# Patient Record
Sex: Female | Born: 1950 | Hispanic: Yes | Marital: Married | Smoking: Never smoker
Health system: Southern US, Community
[De-identification: ages and names within clinical notes are randomized; demographics above are authoritative.]

## PROBLEM LIST (undated history)

## (undated) DIAGNOSIS — I10 Essential (primary) hypertension: Secondary | ICD-10-CM

## (undated) DIAGNOSIS — K7682 Hepatic encephalopathy: Secondary | ICD-10-CM

## (undated) DIAGNOSIS — K269 Duodenal ulcer, unspecified as acute or chronic, without hemorrhage or perforation: Secondary | ICD-10-CM

## (undated) DIAGNOSIS — H527 Unspecified disorder of refraction: Secondary | ICD-10-CM

## (undated) DIAGNOSIS — R1030 Lower abdominal pain, unspecified: Secondary | ICD-10-CM

## (undated) DIAGNOSIS — I358 Other nonrheumatic aortic valve disorders: Secondary | ICD-10-CM

## (undated) DIAGNOSIS — S9032XA Contusion of left foot, initial encounter: Secondary | ICD-10-CM

## (undated) DIAGNOSIS — K219 Gastro-esophageal reflux disease without esophagitis: Secondary | ICD-10-CM

## (undated) DIAGNOSIS — I81 Portal vein thrombosis: Secondary | ICD-10-CM

## (undated) DIAGNOSIS — D696 Thrombocytopenia, unspecified: Secondary | ICD-10-CM

## (undated) DIAGNOSIS — K7460 Unspecified cirrhosis of liver: Secondary | ICD-10-CM

## (undated) DIAGNOSIS — A419 Sepsis, unspecified organism: Secondary | ICD-10-CM

## (undated) DIAGNOSIS — R7881 Bacteremia: Secondary | ICD-10-CM

## (undated) DIAGNOSIS — M1990 Unspecified osteoarthritis, unspecified site: Secondary | ICD-10-CM

## (undated) DIAGNOSIS — B1920 Unspecified viral hepatitis C without hepatic coma: Secondary | ICD-10-CM

## (undated) DIAGNOSIS — B9561 Methicillin susceptible Staphylococcus aureus infection as the cause of diseases classified elsewhere: Secondary | ICD-10-CM

## (undated) DIAGNOSIS — Z8679 Personal history of other diseases of the circulatory system: Secondary | ICD-10-CM

## (undated) DIAGNOSIS — M722 Plantar fascial fibromatosis: Secondary | ICD-10-CM

## (undated) DIAGNOSIS — B9681 Helicobacter pylori [H. pylori] as the cause of diseases classified elsewhere: Secondary | ICD-10-CM

## (undated) DIAGNOSIS — C220 Liver cell carcinoma: Secondary | ICD-10-CM

## (undated) DIAGNOSIS — M25551 Pain in right hip: Secondary | ICD-10-CM

## (undated) DIAGNOSIS — Z1211 Encounter for screening for malignant neoplasm of colon: Secondary | ICD-10-CM

## (undated) DIAGNOSIS — N814 Uterovaginal prolapse, unspecified: Secondary | ICD-10-CM

## (undated) DIAGNOSIS — R338 Other retention of urine: Secondary | ICD-10-CM

## (undated) DIAGNOSIS — K7290 Hepatic failure, unspecified without coma: Secondary | ICD-10-CM

## (undated) HISTORY — DX: Liver cell carcinoma: C22.0

## (undated) HISTORY — DX: Pain in right hip: M25.551

## (undated) HISTORY — DX: Essential (primary) hypertension: I10

## (undated) HISTORY — DX: Lower abdominal pain, unspecified: R10.30

## (undated) HISTORY — DX: Hepatic failure, unspecified without coma: K72.90

## (undated) HISTORY — DX: Bacteremia: R78.81

## (undated) HISTORY — DX: Gastro-esophageal reflux disease without esophagitis: K21.9

## (undated) HISTORY — DX: Encounter for screening for malignant neoplasm of colon: Z12.11

## (undated) HISTORY — DX: Plantar fascial fibromatosis: M72.2

## (undated) HISTORY — DX: Methicillin susceptible Staphylococcus aureus infection as the cause of diseases classified elsewhere: B95.61

## (undated) HISTORY — DX: Duodenal ulcer, unspecified as acute or chronic, without hemorrhage or perforation: K26.9

## (undated) HISTORY — DX: Contusion of left foot, initial encounter: S90.32XA

## (undated) HISTORY — PX: TUBAL LIGATION: SHX77

## (undated) HISTORY — DX: Unspecified disorder of refraction: H52.7

## (undated) HISTORY — DX: Hepatic encephalopathy: K76.82

## (undated) HISTORY — DX: Other nonrheumatic aortic valve disorders: I35.8

## (undated) HISTORY — DX: Personal history of other diseases of the circulatory system: Z86.79

## (undated) HISTORY — DX: Portal vein thrombosis: I81

## (undated) HISTORY — DX: Thrombocytopenia, unspecified: D69.6

## (undated) HISTORY — PX: UPPER GI ENDOSCOPY: SHX6162

## (undated) HISTORY — DX: Other retention of urine: R33.8

## (undated) HISTORY — DX: Sepsis, unspecified organism: A41.9

## (undated) HISTORY — DX: Helicobacter pylori (H. pylori) as the cause of diseases classified elsewhere: B96.81

---

## 2020-01-22 ENCOUNTER — Emergency Department
Admission: EM | Admit: 2020-01-22 | Discharge: 2020-01-22 | Disposition: A | Discharge status: Home / Self Care | Payer: Medicare Other | Attending: Emergency Medicine | Admitting: Emergency Medicine

## 2020-01-22 ENCOUNTER — Emergency Department: Payer: Medicare Other

## 2020-01-22 ENCOUNTER — Encounter: Payer: Self-pay | Admitting: *Deleted

## 2020-01-22 ENCOUNTER — Other Ambulatory Visit: Payer: Self-pay

## 2020-01-22 DIAGNOSIS — R251 Tremor, unspecified: Secondary | ICD-10-CM | POA: Diagnosis not present

## 2020-01-22 DIAGNOSIS — R1030 Lower abdominal pain, unspecified: Secondary | ICD-10-CM | POA: Insufficient documentation

## 2020-01-22 DIAGNOSIS — R1031 Right lower quadrant pain: Secondary | ICD-10-CM

## 2020-01-22 HISTORY — DX: Uterovaginal prolapse, unspecified: N81.4

## 2020-01-22 HISTORY — DX: Unspecified viral hepatitis C without hepatic coma: B19.20

## 2020-01-22 HISTORY — DX: Unspecified cirrhosis of liver: K74.60

## 2020-01-22 LAB — URINALYSIS, COMPLETE (UACMP) WITH MICROSCOPIC
Bilirubin Urine: NEGATIVE
Glucose, UA: NEGATIVE mg/dL
Hgb urine dipstick: NEGATIVE
Ketones, ur: 5 mg/dL — AB
Leukocytes,Ua: NEGATIVE
Nitrite: NEGATIVE
Protein, ur: NEGATIVE mg/dL
Specific Gravity, Urine: 1.023 (ref 1.005–1.030)
pH: 5 (ref 5.0–8.0)

## 2020-01-22 LAB — CBC
HCT: 43.1 % (ref 36.0–46.0)
Hemoglobin: 14.6 g/dL (ref 12.0–15.0)
MCH: 33 pg (ref 26.0–34.0)
MCHC: 33.9 g/dL (ref 30.0–36.0)
MCV: 97.5 fL (ref 80.0–100.0)
Platelets: 123 10*3/uL — ABNORMAL LOW (ref 150–400)
RBC: 4.42 MIL/uL (ref 3.87–5.11)
RDW: 14.6 % (ref 11.5–15.5)
WBC: 5.9 10*3/uL (ref 4.0–10.5)
nRBC: 0 % (ref 0.0–0.2)

## 2020-01-22 LAB — COMPREHENSIVE METABOLIC PANEL
ALT: 29 U/L (ref 0–44)
AST: 53 U/L — ABNORMAL HIGH (ref 15–41)
Albumin: 3.2 g/dL — ABNORMAL LOW (ref 3.5–5.0)
Alkaline Phosphatase: 186 U/L — ABNORMAL HIGH (ref 38–126)
Anion gap: 8 (ref 5–15)
BUN: 16 mg/dL (ref 8–23)
CO2: 23 mmol/L (ref 22–32)
Calcium: 8.7 mg/dL — ABNORMAL LOW (ref 8.9–10.3)
Chloride: 105 mmol/L (ref 98–111)
Creatinine, Ser: 0.61 mg/dL (ref 0.44–1.00)
GFR calc Af Amer: >60 mL/min (ref >60)
GFR calc non Af Amer: >60 mL/min (ref >60)
Glucose, Bld: 116 mg/dL — ABNORMAL HIGH (ref 70–99)
Potassium: 3.4 mmol/L — ABNORMAL LOW (ref 3.5–5.1)
Sodium: 136 mmol/L (ref 135–145)
Total Bilirubin: 3.1 mg/dL — ABNORMAL HIGH (ref 0.3–1.2)
Total Protein: 7.4 g/dL (ref 6.5–8.1)

## 2020-01-22 LAB — LIPASE, BLOOD: Lipase: 41 U/L (ref 11–51)

## 2020-01-22 LAB — AMMONIA: Ammonia: 34 umol/L (ref 9–35)

## 2020-01-22 MED ORDER — HALOPERIDOL LACTATE 5 MG/ML IJ SOLN
5.0000 mg | Freq: Once | INTRAMUSCULAR | Status: AC
  Administered 2020-01-22: 5 mg via INTRAVENOUS
  Filled 2020-01-22: qty 1

## 2020-01-22 MED ORDER — IOHEXOL 300 MG/ML  SOLN
100.0000 mL | Freq: Once | INTRAMUSCULAR | Status: AC | PRN
  Administered 2020-01-22: 100 mL via INTRAVENOUS

## 2020-01-22 MED ORDER — TRAMADOL HCL 50 MG PO TABS: 50.0000 mg | ORAL_TABLET | Freq: Four times a day (QID) | ORAL | 0 refills | Status: AC | PRN

## 2020-01-22 MED ORDER — GADOBUTROL 1 MMOL/ML IV SOLN
7.5000 mL | Freq: Once | INTRAVENOUS | Status: AC | PRN
  Administered 2020-01-22: 10 mL via INTRAVENOUS

## 2020-01-22 MED ORDER — TRAMADOL HCL 50 MG PO TABS
50.0000 mg | ORAL_TABLET | Freq: Once | ORAL | Status: AC
  Administered 2020-01-22: 50 mg via ORAL
  Filled 2020-01-22: qty 1

## 2020-01-22 MED ORDER — SODIUM CHLORIDE 0.9 % IV SOLN: Freq: Once | INTRAVENOUS | Status: AC

## 2020-01-22 NOTE — ED Notes (Signed)
Pt c/o weakness and pain in her legs that started this morning. Pt states she is noramally able to walk but has not been able to today. Pt is aox4, nad noted, speaking with daughter on personal cell. Pt provided additional blanket. CMS in lower extremities is intact.

## 2020-01-22 NOTE — ED Notes (Signed)
Pt transported to CT at this time.

## 2020-01-22 NOTE — ED Notes (Signed)
[facility] called- states that radiologist reading will not be available until morning but they will have a preliminary reading done. MD notified.

## 2020-01-22 NOTE — ED Provider Notes (Signed)
[facility] Emergency Department Provider Note  ____________________________________________   I have reviewed the triage vital signs and the nursing notes.   HISTORY  Chief Complaint Abdominal Pain   History limited by: Not Limited   HPI Linda Romero is a 69 y.o. female who presents to the emergency department today because of concerns for abdominal pain.  The pain started last night around 3 AM.  She noticed that when she got up to use the restroom.  It is located in the suprapubic region.  She does have pain in bilateral groin as well. The pain is worse when she stands up or tries to walk.  She has not noticed any nausea vomiting or change in stooling.  Denies any dysuria or bad odor to her urine.  Denies similar symptoms in the past. Has a history of uterine prolapse states it has been fixed and this does not remind her of any problems she has had without in the past.  Denies any trauma to her abdomen.  Additionally the patient is concerned that her ammonia level might be elevated since she has had some shaking.   Records reviewed. Per medical record review patient has a history of cirrhosis, uterine prolapse. Has been seen in the past for pelvic pain. Has history of SI infection. Was seen at [facility] at the end of 2019 for hip pain, had MRI done at that time without obvious etiology.   Past Medical History:  Diagnosis Date  . Hepatitis C   . Liver cirrhosis ([place])   . Uterine prolapse     There are no problems to display for this patient.   History reviewed. No pertinent surgical history.  Prior to Admission medications   Not on File    Allergies Patient has no known allergies.  No family history on file.  Social History Social History   Tobacco Use  . Smoking status: Never Smoker  . Smokeless tobacco: Never Used  Substance Use Topics  . Alcohol use: Never  . Drug use: Never    Review of Systems Constitutional: No fever/chills Eyes: No  visual changes. ENT: No sore throat. Cardiovascular: Denies chest pain. Respiratory: Denies shortness of breath. Gastrointestinal: Positive for lower abdominal pain.   Genitourinary: Negative for dysuria. Musculoskeletal: Positive for bilateral upper leg pain. Skin: Negative for rash. Neurological: Negative for headaches, focal weakness or numbness.  ____________________________________________   PHYSICAL EXAM:  VITAL SIGNS: ED Triage Vitals  Enc Vitals Group     BP 01/22/20 1151 (!) 140/55     Pulse Rate 01/22/20 1151 86     Resp 01/22/20 1151 16     Temp 01/22/20 1151 98.9 F (37.2 C)     Temp Source 01/22/20 1151 Oral     SpO2 01/22/20 1151 100 %     Weight 01/22/20 1218 180 lb (81.6 kg)     Height 01/22/20 1218 5' 4"  (1.626 m)     Head Circumference --      Peak Flow --      Pain Score 01/22/20 1217 0   Constitutional: Alert and oriented.  Eyes: Conjunctivae are normal.  ENT      Head: Normocephalic and atraumatic.      Nose: No congestion/rhinnorhea.      Mouth/Throat: Mucous membranes are moist.      Neck: No stridor. Hematological/Lymphatic/Immunilogical: No cervical lymphadenopathy. Cardiovascular: Normal rate, regular rhythm.  No murmurs, rubs, or gallops.  Respiratory: Normal respiratory effort without tachypnea nor retractions. Breath sounds are  clear and equal bilaterally. No wheezes/rales/rhonchi. Gastrointestinal: Soft and non tender. No rebound. No guarding.  Genitourinary: Deferred Musculoskeletal: Decreased ROM of the hips. Difficulty with ambulation. Neurologic:  Normal speech and language. No gross focal neurologic deficits are appreciated.  Skin:  Skin is warm, dry and intact. No rash noted. Psychiatric: Mood and affect are normal. Speech and behavior are normal. Patient exhibits appropriate insight and judgment.  ____________________________________________    LABS (pertinent positives/negatives)  Lipase 41 UA hazy, ketones 5, 0-5 rbc and  wbc, rare bacteria, 11-20 squamous CMP na 136, k 3.4, glu 116, cr 0.61, ca 8.7, ast 53, alt 29, alk phos 186, t bili 3.1 CBC wbc 5.9, hgb 14.6, plt 123 Ammonia 34 ____________________________________________   EKG  I, Nance Pear, attending physician, personally viewed and interpreted this EKG  EKG Time: 1236 Rate: 82 Rhythm: normal sinus rhythm Axis: left axis deviation Intervals: qtc 432 QRS: narrow ST changes: no st elevation Impression: abnormal ekg ____________________________________________    RADIOLOGY  CT abd/pel Normal appendix. No obstruction. Cirrhosis.   MRI pelvis - preliminary read without infection/inflammation/fracture  ____________________________________________   PROCEDURES  Procedures  ____________________________________________   INITIAL IMPRESSION / ASSESSMENT AND PLAN / ED COURSE  Pertinent labs & imaging results that were available during my care of the patient were reviewed by me and considered in my medical decision making (see chart for details).   Patient presented to the emergency department today with suprapubic abdominal pain as well as bilateral pain which she calls her leg pain although points more to the groin area.  She does appear to have discomfort primarily with walking.  Although she denies similar symptoms in the past per chart review she was seen at [facility] for very similar sounding pain at the end of 2019.  Did have MRI done at that time without any obvious etiology of the hip pain.  I did repeat MRI today given history of infection did joints.  Preliminary read without concerning findings and certainly blood work and lack of fever would not suggest infection at this time.  I do think it is important the patient follow back with [facility] and I discussed this with the patient.  Will give patient short course of tramadol to help with discomfort.  ____________________________________________   FINAL CLINICAL  IMPRESSION(S) / ED DIAGNOSES  Final diagnoses:  Bilateral groin pain     Note: This dictation was prepared with Dragon dictation. Any transcriptional errors that result from this process are unintentional     Nance Pear, MD 01/22/20 2156

## 2020-01-22 NOTE — ED Notes (Signed)
Pt was unable to walk using walker, stating it is still too painful. Pt brought to bathroom in wheelchair. ED MD notified.

## 2020-01-22 NOTE — ED Notes (Addendum)
Daughter called at this time and update provided- mobile provided to pt. Daughter's cell number is [phone]

## 2020-01-22 NOTE — ED Notes (Signed)
Pt taken to MRI  

## 2020-01-22 NOTE — ED Triage Notes (Addendum)
Patient c/o severe lower abdominal pain since 0300 this morning. Patient states she got up to the bathroom and was in pain. Patient denies dysuria and had a regular BM at 0200 this morning. Patient has a history of liver cirrhosis. Patient states the pain is severe enough that it affects her walking. Patient states she doesn't want to see an NP or PA.

## 2020-01-22 NOTE — ED Notes (Signed)
Pt moved into room and changed into gown per provider request.

## 2020-01-22 NOTE — ED Notes (Signed)
Called MRI to check on pending result. MRI states that reading may not be done until morning due to the fact that body scan radiologist may not be available at this time. Will call [facility] to verify.

## 2020-01-22 NOTE — Discharge Instructions (Signed)
Please follow up with Dr. Lynann Bologna who has seen you in the past for similar complaint. Please seek medical attention for any high fevers, chest pain, shortness of breath, change in behavior, persistent vomiting, bloody stool or any other new or concerning symptoms.

## 2020-01-22 NOTE — ED Notes (Signed)
Husband Linda Romero updated by phone at this time.

## 2020-01-23 ENCOUNTER — Other Ambulatory Visit: Payer: Self-pay

## 2020-01-23 ENCOUNTER — Encounter: Payer: Self-pay | Admitting: Emergency Medicine

## 2020-01-23 ENCOUNTER — Emergency Department
Admission: EM | Admit: 2020-01-23 | Discharge: 2020-01-26 | Disposition: A | Discharge status: Home / Self Care | Payer: Medicare Other | Attending: Emergency Medicine | Admitting: Emergency Medicine

## 2020-01-23 DIAGNOSIS — R102 Pelvic and perineal pain: Secondary | ICD-10-CM | POA: Insufficient documentation

## 2020-01-23 DIAGNOSIS — B1920 Unspecified viral hepatitis C without hepatic coma: Secondary | ICD-10-CM | POA: Diagnosis not present

## 2020-01-23 DIAGNOSIS — Z79899 Other long term (current) drug therapy: Secondary | ICD-10-CM | POA: Diagnosis not present

## 2020-01-23 DIAGNOSIS — G8929 Other chronic pain: Secondary | ICD-10-CM | POA: Diagnosis not present

## 2020-01-23 DIAGNOSIS — R531 Weakness: Secondary | ICD-10-CM | POA: Diagnosis present

## 2020-01-23 DIAGNOSIS — M79605 Pain in left leg: Secondary | ICD-10-CM | POA: Insufficient documentation

## 2020-01-23 DIAGNOSIS — K7460 Unspecified cirrhosis of liver: Secondary | ICD-10-CM | POA: Insufficient documentation

## 2020-01-23 DIAGNOSIS — M79604 Pain in right leg: Secondary | ICD-10-CM | POA: Insufficient documentation

## 2020-01-23 DIAGNOSIS — N814 Uterovaginal prolapse, unspecified: Secondary | ICD-10-CM | POA: Insufficient documentation

## 2020-01-23 DIAGNOSIS — Z20822 Contact with and (suspected) exposure to covid-19: Secondary | ICD-10-CM | POA: Insufficient documentation

## 2020-01-23 LAB — CBC
HCT: 43.9 % (ref 36.0–46.0)
Hemoglobin: 15 g/dL (ref 12.0–15.0)
MCH: 33 pg (ref 26.0–34.0)
MCHC: 34.2 g/dL (ref 30.0–36.0)
MCV: 96.7 fL (ref 80.0–100.0)
Platelets: 110 10*3/uL — ABNORMAL LOW (ref 150–400)
RBC: 4.54 MIL/uL (ref 3.87–5.11)
RDW: 14.8 % (ref 11.5–15.5)
WBC: 13.1 10*3/uL — ABNORMAL HIGH (ref 4.0–10.5)
nRBC: 0 % (ref 0.0–0.2)

## 2020-01-23 LAB — BASIC METABOLIC PANEL
Anion gap: 8 (ref 5–15)
BUN: 17 mg/dL (ref 8–23)
CO2: 20 mmol/L — ABNORMAL LOW (ref 22–32)
Calcium: 9 mg/dL (ref 8.9–10.3)
Chloride: 106 mmol/L (ref 98–111)
Creatinine, Ser: 0.61 mg/dL (ref 0.44–1.00)
GFR calc Af Amer: >60 mL/min (ref >60)
GFR calc non Af Amer: >60 mL/min (ref >60)
Glucose, Bld: 132 mg/dL — ABNORMAL HIGH (ref 70–99)
Potassium: 3.6 mmol/L (ref 3.5–5.1)
Sodium: 134 mmol/L — ABNORMAL LOW (ref 135–145)

## 2020-01-23 LAB — GLUCOSE, CAPILLARY: Glucose-Capillary: 109 mg/dL — ABNORMAL HIGH (ref 70–99)

## 2020-01-23 MED ORDER — FUROSEMIDE 40 MG PO TABS
40.0000 mg | ORAL_TABLET | Freq: Every day | ORAL | Status: DC
  Administered 2020-01-23 – 2020-01-26 (×4): 40 mg via ORAL
  Filled 2020-01-23 (×5): qty 1

## 2020-01-23 MED ORDER — SODIUM CHLORIDE 0.9% FLUSH: 3.0000 mL | Freq: Once | INTRAVENOUS | Status: DC

## 2020-01-23 MED ORDER — LACTULOSE 10 GM/15ML PO SOLN
20.0000 g | Freq: Every day | ORAL | Status: DC
  Administered 2020-01-23 – 2020-01-26 (×4): 20 g via ORAL
  Filled 2020-01-23 (×4): qty 30

## 2020-01-23 MED ORDER — SPIRONOLACTONE 25 MG PO TABS
50.0000 mg | ORAL_TABLET | Freq: Every day | ORAL | Status: DC
  Administered 2020-01-23 – 2020-01-25 (×3): 50 mg via ORAL
  Filled 2020-01-23 (×4): qty 2

## 2020-01-23 NOTE — ED Triage Notes (Signed)
Pt presents to ED via POV with c/o weakness and leg pain to BLE, pt states was D/C yesterday and slept in the car for 6 hrs due to being unable to get out of the car when she got home. Pt drowsy but arousable upon arrival to triage room.

## 2020-01-23 NOTE — ED Triage Notes (Signed)
PT took dose of Tramadol at 1030am this morning.  Per SIL patient has had discolored urine with foul odor as well.   Pt is alert and oriented on arrival, mask in place. Pt placed in wheelchair-assisted from vehicle.

## 2020-01-23 NOTE — ED Provider Notes (Signed)
[facility] Emergency Department Provider Note  ____________________________________________  Time seen: Approximately 11:25 PM  I have reviewed the triage vital signs and the nursing notes.   HISTORY  Chief Complaint Weakness and Leg Pain    HPI Linda Romero is a 69 y.o. female with a history of liver cirrhosis who comes the ED today complaining of bilateral leg pain.  Was seen in the emergency department yesterday for similar symptoms, had extensive evaluation which was overall reassuring.  Lab panel is normal, ammonia level is normal.  Urinalysis unremarkable.  CT scan of abdomen and pelvis and MRI of the pelvis were all unremarkable without acute findings.  As noted from yesterday's evaluation in the ED, this pain appears to be chronic in nature.  Also reporting generalized weakness.  After discharge from the ED yesterday, reportedly she was not able to get herself out of her car, and slept in the car for several hours.     Past Medical History:  Diagnosis Date  . Hepatitis C   . Liver cirrhosis ([place])   . Uterine prolapse      There are no problems to display for this patient.    History reviewed. No pertinent surgical history.   Prior to Admission medications   Medication Sig Start Date End Date Taking? Authorizing Provider  furosemide (LASIX) 20 MG tablet Take 40 mg by mouth daily.   Yes [provider]  lactulose (CHRONULAC) 10 GM/15ML solution Take 20 g by mouth daily.   Yes [provider]  spironolactone (ALDACTONE) 50 MG tablet Take 50 mg by mouth daily.   Yes [provider]  traMADol (ULTRAM) 50 MG tablet Take 1 tablet (50 mg total) by mouth every 6 (six) hours as needed. 01/22/20 01/21/21 Yes Nance Pear, MD     Allergies Patient has no known allergies.   History reviewed. No pertinent family history.  Social History Social History   Tobacco Use  . Smoking status: Never Smoker  . Smokeless  tobacco: Never Used  Substance Use Topics  . Alcohol use: Never  . Drug use: Never    Review of Systems  Constitutional:   No fever or chills.  ENT:   No sore throat. No rhinorrhea. Cardiovascular:   No chest pain or syncope. Respiratory:   No dyspnea or cough. Gastrointestinal:   Negative for abdominal pain, vomiting and diarrhea.  Musculoskeletal: Chronic pelvic pain as above All other systems reviewed and are negative except as documented above in ROS and HPI.  ____________________________________________   PHYSICAL EXAM:  VITAL SIGNS: ED Triage Vitals  Enc Vitals Group     BP 01/23/20 1413 131/73     Pulse Rate 01/23/20 1413 83     Resp 01/23/20 1413 (!) 22     Temp 01/23/20 1413 98.6 F (37 C)     Temp Source 01/23/20 1413 Oral     SpO2 01/23/20 1413 94 %     Weight 01/23/20 1410 180 lb (81.6 kg)     Height 01/23/20 1410 5\' 4"  (1.626 m)     Head Circumference --      Peak Flow --      Pain Score 01/23/20 1409 3     Pain Loc --      Pain Edu? --      Excl. in [place]? --     Vital signs reviewed, nursing assessments reviewed.   Constitutional:   Alert and oriented. Non-toxic appearance.  Talking on the phone.  Cooperative  Eyes:   Conjunctivae are normal. EOMI. PERRL. ENT      Head:   Normocephalic and atraumatic.      Nose:   Wearing a mask.      Mouth/Throat:   Wearing a mask.      Neck:   No meningismus. Full ROM. Hematological/Lymphatic/Immunilogical:   No cervical lymphadenopathy. Cardiovascular:   RRR. Symmetric bilateral radial and DP pulses.  No murmurs. Cap refill less than 2 seconds. Respiratory:   Normal respiratory effort without tachypnea/retractions. Breath sounds are clear and equal bilaterally. No wheezes/rales/rhonchi. Gastrointestinal:   Soft and nontender. Non distended. There is no CVA tenderness.  No rebound, rigidity, or guarding. Musculoskeletal:   Normal range of motion in all extremities. No joint effusions.  No lower extremity tenderness.   No edema. Neurologic:   Normal speech and language.  Motor grossly intact. No acute focal neurologic deficits are appreciated.  Skin:    Skin is warm, dry and intact. No rash noted.  No petechiae, purpura, or bullae.  ____________________________________________    LABS (pertinent positives/negatives) (all labs ordered are listed, but only abnormal results are displayed) Labs Reviewed  BASIC METABOLIC PANEL - Abnormal; Notable for the following components:      Result Value   Sodium 134 (*)    CO2 20 (*)    Glucose, Bld 132 (*)    All other components within normal limits  CBC - Abnormal; Notable for the following components:   WBC 13.1 (*)    Platelets 110 (*)    All other components within normal limits  GLUCOSE, CAPILLARY - Abnormal; Notable for the following components:   Glucose-Capillary 109 (*)    All other components within normal limits  URINALYSIS, COMPLETE (UACMP) WITH MICROSCOPIC  AMMONIA  CBG MONITORING, ED   ____________________________________________   EKG Interpreted by me Normal sinus rhythm, right axis, normal normal intervals.  Normal QRS ST segments and T waves.   ____________________________________________    RADIOLOGY  No results found.  ____________________________________________   PROCEDURES Procedures  ____________________________________________    CLINICAL IMPRESSION / ASSESSMENT AND PLAN / ED COURSE  Medications ordered in the ED: Medications  sodium chloride flush (NS) 0.9 % injection 3 mL (3 mLs Intravenous Not Given 01/23/20 1657)  furosemide (LASIX) tablet 40 mg (40 mg Oral Given 01/23/20 2021)  lactulose (CHRONULAC) 10 GM/15ML solution 20 g (20 g Oral Given 01/23/20 2021)  spironolactone (ALDACTONE) tablet 50 mg (50 mg Oral Given 01/23/20 2021)    Pertinent labs & imaging results that were available during my care of the patient were reviewed by me and considered in my medical decision making (see chart for  details).  [name] was evaluated in Emergency Department on 01/23/2020 for the symptoms described in the history of present illness. She was evaluated in the context of the global COVID-19 pandemic, which necessitated consideration that the patient might be at risk for infection with the SARS-CoV-2 virus that causes COVID-19. Institutional protocols and algorithms that pertain to the evaluation of patients at risk for COVID-19 are in a state of rapid change based on information released by regulatory bodies including the CDC and federal and state organizations. These policies and algorithms were followed during the patient's care in the ED.   Patient returns to the ED after evaluation yesterday due to persistent generalized weakness and chronic pain.  Planes.  Vital signs are normal, exam is benign and reassuring.  Repeat lab panel today is unchanged, unremarkable.  Patient is lucid, calm  and cooperative.  Per report from family, patient is not able to take care of herself at home, spouse unable to care for her, unable to provide sufficient support.  Will consult PT and social work to evaluate care needs, possible placement.      ____________________________________________   FINAL CLINICAL IMPRESSION(S) / ED DIAGNOSES    Final diagnoses:  Chronic pelvic pain in female  Cirrhosis of liver without ascites, unspecified hepatic cirrhosis type [facility])     ED Discharge Orders    None      Portions of this note were generated with dragon dictation software. Dictation errors may occur despite best attempts at proofreading.   Carrie Mew, MD 01/23/20 2330

## 2020-01-23 NOTE — ED Triage Notes (Signed)
FIRST NURSE NOTE: Pt arrived via POV with daughter and son in law, pt was seen here last night and discharged. Per son in law, the patient was discharged unsafely because she slept in her car for 6 hours because she could not ambulate and her husband is not capable of helping her.  Son-in-law states that they live nearby the patient to help, but have a newborn at home.   Pt is not able to walk and had to be helped cleaned up as well.   Son-in-law is asking for placement for patient.  Advised him that typically it is difficult to place from the ED if they do not meet admission criteria and many times will hold in the ED.  Advised SIL that most likely case management will be involved with patient disposition.  SIL Marin Shutter [phone]

## 2020-01-23 NOTE — ED Notes (Signed)
Pt provided with meal tray.

## 2020-01-24 ENCOUNTER — Encounter: Payer: Self-pay | Admitting: Licensed Clinical Social Worker

## 2020-01-24 DIAGNOSIS — R102 Pelvic and perineal pain: Secondary | ICD-10-CM | POA: Diagnosis not present

## 2020-01-24 LAB — URINALYSIS, COMPLETE (UACMP) WITH MICROSCOPIC
Bilirubin Urine: NEGATIVE
Glucose, UA: NEGATIVE mg/dL
Ketones, ur: NEGATIVE mg/dL
Nitrite: NEGATIVE
Protein, ur: NEGATIVE mg/dL
Specific Gravity, Urine: 1.01 (ref 1.005–1.030)
pH: 5 (ref 5.0–8.0)

## 2020-01-24 MED ORDER — OXYCODONE-ACETAMINOPHEN 5-325 MG PO TABS
2.0000 | ORAL_TABLET | Freq: Once | ORAL | Status: AC
  Administered 2020-01-24: 2 via ORAL
  Filled 2020-01-24: qty 2

## 2020-01-24 NOTE — ED Notes (Signed)
Pt with urge to have BM. Pt placed on bedpan and instructed to use call light when she is done.

## 2020-01-24 NOTE — ED Notes (Signed)
Pt had lg BM on bedpan. Pt given lunch tray. No further needs at this time.

## 2020-01-24 NOTE — ED Notes (Signed)
Pt given meal tray and apple juice at this time

## 2020-01-24 NOTE — ED Notes (Signed)
Pt placed on bedpan. Pt able to roll to the side but reporting pain with movement.

## 2020-01-24 NOTE — ED Notes (Signed)
Pts daughter [name] called for an updated. Family had requested a call for an update from MD and report they have yet to receive a phone call. RN apologized for inconvience and family was calm and requesting an update. Family updated to the best of this RNs ability but is requesting social work to call them and update after evaluation.

## 2020-01-24 NOTE — TOC Progression Note (Addendum)
Transition of Care [facility]) - Progression Note    Patient Details  Name: Linda Romero MRN: [id number] Date of Birth: 1951-03-28  Transition of Care [facility]) CM/SW [place], [place] Phone Number: [phone] 01/24/2020, 2:03 PM  Clinical Narrative:    Megan Mans, for SNF placement to [place], [facility], [place], [place].  Family also wanted [facility] but they do not have SNF placement available. Waiting for replies.   Expected Discharge Plan: [place]    Expected Discharge Plan and Services Expected Discharge Plan: [place] Choice: [place] arrangements for the past 2 months: Single Family Home                                       Social Determinants of Health (SDOH) Interventions    Readmission Risk Interventions No flowsheet data found.

## 2020-01-24 NOTE — NC FL2 (Addendum)
  [place] LEVEL OF CARE SCREENING TOOL     IDENTIFICATION  Patient Name: Linda Romero Birthdate: [birth year] Sex: female Admission Date (Current Location): 01/23/2020  Accident and [place] Number:  Engineering geologist and Address:  [facility], [address] [place], [place] [address]      Provider Number: [id number]  Attending Physician Name and Address:  No att. providers found  Relative Name and Phone Number:  Adyn Askins [phone]    Current Level of Care: Hospital Recommended Level of Care: [place] Prior Approval Number:    Date Approved/Denied:   PASRR Number: [id number]  Discharge Plan: SNF    Current Diagnoses: There are no problems to display for this patient.   Orientation RESPIRATION BLADDER Height & Weight     Self, Time, Situation, Place  Normal Continent Weight: 180 lb (81.6 kg) Height:  5\' 4"  (162.6 cm)  BEHAVIORAL SYMPTOMS/MOOD NEUROLOGICAL BOWEL NUTRITION STATUS      Continent Diet  AMBULATORY STATUS COMMUNICATION OF NEEDS Skin   Limited Assist Verbally Normal                       Personal Care Assistance Level of Assistance  Bathing, Dressing Bathing Assistance: Limited assistance Feeding assistance: Limited assistance Dressing Assistance: Limited assistance     Functional Limitations Info             SPECIAL CARE FACTORS FREQUENCY  PT (By licensed PT), OT (By licensed OT)     PT Frequency: Minimum five times 5X weekly OT Frequency: Minimum five times 5X weekly            Contractures      Additional Factors Info                  Current Medications (01/24/2020):  This is the current hospital active medication list Current Facility-Administered Medications  Medication Dose Route Frequency Provider Last Rate Last Admin  . furosemide (LASIX) tablet 40 mg  40 mg Oral Daily Carrie Mew, MD   40 mg at 01/24/20 1020  . lactulose (CHRONULAC) 10 GM/15ML  solution 20 g  20 g Oral Daily Carrie Mew, MD   20 g at 01/24/20 1020  . sodium chloride flush (NS) 0.9 % injection 3 mL  3 mL Intravenous Once Carrie Mew, MD      . spironolactone (ALDACTONE) tablet 50 mg  50 mg Oral Daily Carrie Mew, MD   50 mg at 01/24/20 1020   Current Outpatient Medications  Medication Sig Dispense Refill  . furosemide (LASIX) 20 MG tablet Take 40 mg by mouth daily.    Marland Kitchen lactulose (CHRONULAC) 10 GM/15ML solution Take 20 g by mouth daily.    Marland Kitchen spironolactone (ALDACTONE) 50 MG tablet Take 50 mg by mouth daily.    . traMADol (ULTRAM) 50 MG tablet Take 1 tablet (50 mg total) by mouth every 6 (six) hours as needed. 15 tablet 0     Discharge Medications: Please see discharge summary for a list of discharge medications.  Relevant Imaging Results:  Relevant Lab Results:   Additional Information SS# [id number]  Adelene Amas, LCSWA

## 2020-01-24 NOTE — TOC Initial Note (Addendum)
Transition of Care [facility]) - Initial/Assessment Note    Patient Details  Name: Linda Romero MRN: [id number] Date of Birth: May 29, 1951  Transition of Care [facility]) CM/SW Contact:    Linda Romero Phone Number: [phone] 01/24/2020, 10:43 AM  Clinical Narrative:                 Patient is in the ED due to bi-lateral leg pain and weakness.  Patient was in the ED last night for the same reason and was unable to get out of her car when she arrive at home.  Patient has support from daughter Linda Romero [phone], and daughter's spouse.  Patient lives with spouse but he is unable to assist her with ADLs and walking.  Patient is oriented X4, and is active in decision making.  This SW spoke with the patient and her daughter and explained SNF placement has been recommended.  Patient and daughter both understood and stated their preferences for SNF placement in the following order; [facility], [place], [place], [facility], [facility].  Patient prefers a private room and will self-pay the difference insurance does not cover for private room.  Patient and daughter are both concerned about COVID safety protocols and expressed a preference for a facility that has vaccinated their patients. Patient inquired whether she would be admitted to inpatient care.  This SW stated she was not aware but this was something the EDP would share with her.  Patient also wanted to know if the EDP had identified why she is having weakness and pains in her legs.  This SW explain the EDP or RN would be able to answer those questions. Patient understood.   Patient's daughter will be reaching out to this SW if there are any other SNF facilities they prefer.  This SW gave her my contact information.  Patient was resting comfortably and had no other questions or needs.  Expected Discharge Plan: [place]     Patient Goals and CMS Choice Patient states their goals for this  hospitalization and ongoing recovery are:: "To be able to walk again without pain."   Choice offered to / list presented to : Patient, Adult Children  Expected Discharge Plan and Services Expected Discharge Plan: [place] Choice: [place] arrangements for the past 2 months: Single Family Home                                      Prior Living Arrangements/Services Living arrangements for the past 2 months: Single Family Home Lives with:: Spouse(Spouse unable to assist her with ADLs) Patient language and need for interpreter reviewed:: Yes Do you feel safe going back to the place where you live?: Yes      Need for Family Participation in Patient Care: Yes (Comment) Care giver support system in place?: Yes (comment)(Spouse lives with patient, daughter and her spouse live a block away from patient, they see each other regularly.)   Criminal Activity/Legal Involvement Pertinent to Current Situation/Hospitalization: No - Comment as needed  Activities of Daily Living      Permission Sought/Granted Permission sought to share information with : Family Supports Permission granted to share information with : Yes, Verbal Permission Granted  Share Information with NAME: Linda Romero     Permission granted to share info w Relationship: daughter  Permission granted  to share info w Contact Information: [phone]  Emotional Assessment Appearance:: Appears stated age Attitude/Demeanor/Rapport: Engaged Affect (typically observed): Accepting, Adaptable, Stable, Other (comment)(Paitent is active in decision making.) Orientation: : Oriented to Self, Oriented to Place, Oriented to  Time, Oriented to Situation Alcohol / Substance Use: Never Used Psych Involvement: No (comment)  Admission diagnosis:  both legs cannot walk There are no problems to display for this patient.  PCP:  Linda Dross, MD Pharmacy:   [facility] [id number] Rom[address], [place] [address] Phone: [phone] Fax: [phone]     Social Determinants of Health (SDOH) Interventions    Readmission Risk Interventions No flowsheet data found.

## 2020-01-24 NOTE — ED Notes (Signed)
Pt awake and requesting something to drink. Pt given Gatorade at bedside per request and drinking independently. Pt reports she has been sleeping well and denies further needs at this time.

## 2020-01-24 NOTE — Evaluation (Signed)
Physical Therapy Evaluation Patient Details Name: Linda Romero MRN: [id number] DOB: 1951/08/16 Today's Date: 01/24/2020   History of Present Illness  Linda Romero is a 22yoF who comes to [facility] on 01/22/20 c severe suprapubic ABD pain. ED MD notes bilat groin pain as well pt describes as 'leg pain.' Pt has imaging done, then DC to home. Pt back next day as she was in too much pain to get out of car, slept there for 6 hours. Thus far imaging studies of ABD and pelvis have been unremarkable for CC. PMH: cirrhosis, uterine prolapse, SI infection, hepatitis C.  Clinical Impression  Pt admitted with above diagnosis. Pt currently with functional limitations due to the deficits listed below (see "PT Problem List"). Upon entry, pt in bed, awake and agreeable to participate. The pt is alert and oriented x4, pleasant, conversational, and generally a good historian. Pt reports ABD pain has resolved at time of evaluation, however bilat groin pain 5/10 remains at rest, worse with movement of legs. Deep palpation of proximal quads, lateral gluteals, and femoral pulse tolerated well. Deep palpation of lateral ABD tolerated well. Pt has symptomatic pain with palpation around ASIS and inguinal ligaments. ModA for bed mobility, pt struggling to sit unsupported d/t large pain increase. Pt has intact sensation and ankle function. Transfers deferred at this time, until additional revealing medical workup or improved pain control. Pt's acute pain is disabling and if not resolved, pt should be considered nonambulatory at DC provided with extensive DME to meet these needs. Pt will benefit from skilled PT intervention to increase independence and safety with basic mobility in preparation for discharge to the venue listed below.       Follow Up Recommendations SNF;Supervision - Intermittent;Supervision for mobility/OOB    Equipment Recommendations       Recommendations for Other Services       Precautions / Restrictions  Precautions Precautions: Fall Restrictions Weight Bearing Restrictions: No      Mobility  Bed Mobility Overal bed mobility: (P) Needs Assistance Bed Mobility: (P) Supine to Sit;Sit to Supine     Supine to sit: (P) Mod assist Sit to supine: (P) Mod assist   General bed mobility comments: (P) requires assist of legs d/t pain  Transfers Overall transfer level: (P) (deferred; pt has no clear correlating diagnosis, pain severely limiting)                  Ambulation/Gait                Stairs            Wheelchair Mobility    Modified Rankin (Stroke Patients Only)       Balance                                             Pertinent Vitals/Pain Pain Assessment: 0-10 Pain Score: 5  Pain Location: bilat groin area, worse with movement/reposition Pain Descriptors / Indicators: Aching Pain Intervention(s): Limited activity within patient's tolerance;Monitored during session;Repositioned    Home Living Family/patient expects to be discharged to:: Private residence Living Arrangements: Spouse/significant other(husband is unable to provide physical assistance) Available Help at Discharge: Family Type of Home: House Home Access: Stairs to enter Entrance Stairs-Rails: Can reach both;Left;Right Entrance Stairs-Number of Steps: 3 Home Layout: One level Home Equipment: Wheelchair - manual      Prior Function Level  of Independence: Independent with assistive device(s)         Comments: no falls history past 6 months, no use of device for mobility, reports no difficulty with entry stairs; limited community AMB, independent in ADL.     Hand Dominance        Extremity/Trunk Assessment   Upper Extremity Assessment Upper Extremity Assessment: Overall WFL for tasks assessed    Lower Extremity Assessment Lower Extremity Assessment: (normal sensation and motor function below the knees; exam above the knees is limited d/t pain)     Cervical / Trunk Assessment Cervical / Trunk Assessment: Normal  Communication   Communication: No difficulties  Cognition Arousal/Alertness: Awake/alert Behavior During Therapy: WFL for tasks assessed/performed Overall Cognitive Status: Within Functional Limits for tasks assessed                                        General Comments      Exercises     Assessment/Plan    PT Assessment Patient needs continued PT services  PT Problem List Decreased strength;Decreased activity tolerance;Decreased mobility       PT Treatment Interventions DME instruction;Gait training;Stair training;Functional mobility training;Therapeutic activities;Therapeutic exercise;Wheelchair mobility training;Patient/family education    PT Goals (Current goals can be found in the Care Plan section)  Acute Rehab PT Goals Patient Stated Goal: decrease pain PT Goal Formulation: With patient Time For Goal Achievement: 02/07/20 Potential to Achieve Goals: Poor    Frequency Min 2X/week   Barriers to discharge Decreased caregiver support;Inaccessible home environment stairs to enter; husband unable to provide physical assistance    Co-evaluation               AM-PAC PT "6 Clicks" Mobility  Outcome Measure Help needed turning from your back to your side while in a flat bed without using bedrails?: A Lot Help needed moving from lying on your back to sitting on the side of a flat bed without using bedrails?: A Lot Help needed moving to and from a bed to a chair (including a wheelchair)?: A Lot Help needed standing up from a chair using your arms (e.g., wheelchair or bedside chair)?: A Lot Help needed to walk in hospital room?: Total Help needed climbing 3-5 steps with a railing? : Total 6 Click Score: 10    End of Session   Activity Tolerance: Patient limited by pain Patient left: in bed;with call bell/phone within reach Nurse Communication: Mobility status PT Visit  Diagnosis: Difficulty in walking, not elsewhere classified (R26.2);Muscle weakness (generalized) (M62.81);Other abnormalities of gait and mobility (R26.89)    Time: 0955-1006 PT Time Calculation (min) (ACUTE ONLY): 11 min   Charges:   PT Evaluation $PT Eval Low Complexity: 1 Low          12:26 PM, 01/24/20 Etta Grandchild, PT, DPT Physical Therapist - [facility]  [phone] ([place])   Wiatt Mahabir C 01/24/2020, 12:21 PM

## 2020-01-25 DIAGNOSIS — R102 Pelvic and perineal pain: Secondary | ICD-10-CM | POA: Diagnosis not present

## 2020-01-25 LAB — RESPIRATORY PANEL BY RT PCR (FLU A&B, COVID)
Influenza A by PCR: NEGATIVE
Influenza B by PCR: NEGATIVE
SARS Coronavirus 2 by RT PCR: NEGATIVE

## 2020-01-25 MED ORDER — OXYCODONE HCL 5 MG PO CAPS: 5.0000 mg | ORAL_CAPSULE | Freq: Four times a day (QID) | ORAL | 0 refills | Status: DC | PRN

## 2020-01-25 MED ORDER — SODIUM CHLORIDE 0.9 % IV BOLUS
1000.0000 mL | Freq: Once | INTRAVENOUS | Status: AC
  Administered 2020-01-25: 1000 mL via INTRAVENOUS

## 2020-01-25 MED ORDER — KETOROLAC TROMETHAMINE 30 MG/ML IJ SOLN
15.0000 mg | Freq: Once | INTRAMUSCULAR | Status: AC
  Administered 2020-01-25: 15 mg via INTRAVENOUS
  Filled 2020-01-25: qty 1

## 2020-01-25 NOTE — ED Notes (Addendum)
Pt called out to use bedpan to have BM. RN asked pt if she would like to attempt to get up to toilet at bedside. Pt states " No, quit asking me to get up, its aggravating and depressing, just stop asking". Pt refused to even consider using a bedside toilet or attempt to get up from bed. When using bed pain pt was able to bed the right leg and use it to pick up on her bottom and roll over in bed without any difficulty.

## 2020-01-25 NOTE — ED Provider Notes (Signed)
-----------------------------------------   10:41 PM on 01/25/2020 -----------------------------------------  Patient received in turnover pending further evaluation by social work for potential placement.  She has been accepted for placement at [facility], however her son called stating that family was not interested in this due to a Covid outbreak there.  I had a long discussion with patient's son over the phone, who is requesting that she be admitted to the hospital, however there does not appear to be a medical reason for admission.  She complains of bilateral groin pain at this time, however had a complete work-up for this, including negative MRI, 3 days prior.  Repeat lab work and UA today are also unremarkable.  Is concerned that patient a septic and had a "blood infection".  I explained to them that seems unlikely given her vital signs are unremarkable, she is afebrile, and she only has a mild leukocytosis with no apparent infectious source.  She certainly does not meet sepsis criteria and I am not concerned for any infectious process at this time.  Family also requesting transfer to [facility] emergency room, however this does not seem indicated given no emergent pathology. Patient advocate to speak with patient and family in order to determine how we can best meet her needs.   Blake Divine, MD 01/26/20 [phone]

## 2020-01-25 NOTE — ED Provider Notes (Addendum)
-----------------------------------------   11:56 AM on 01/25/2020 -----------------------------------------  I took over care for the morning shift; sign out on this patient was that she was waiting for social work disposition for possible placement.  I was informed by the social worker T. Maxey that the patient did not qualify for SNF placement based on her insurance, and that home health could be provided.  I was told that the patient's family member Adam Mets wanted to speak with me to possibly advocate for admission.  I reassessed the patient.  At this time, her vital signs are stable.  She reports bilateral hip area pain, however she is able to bend both hips and knees without difficulty and push herself up in the bed.  I reviewed the lab workup and imaging from the last several days, which shows no notable acute findings.  I called Mr. Mets and discussed the situation with him.  He was very pleasant and reasonable over the phone.  He explained that he felt that an SNF would be ideal for the patient, but that she did not qualify for an SNF without an inpatient stay of 3 days.  He stated he wanted to advocate for the patient and see what could be done to get her admitted in order so that she could ultimately get to an SNF.  He was concerned that there could be a "catastrophic outcome" if the patient went home even with home health.    I expressed understanding of his frustration and his concerns about the patient, however I explained that at this time, given her negative workup and lack of acute medical needs there is no criteria for inpatient admission, and that I did not have unilateral privileges to admit a patient.  He asked to speak to a patient advocate or administrative representative, I told him I would be happy to put him in touch with administration to escalate on his behalf.  I informed the charge RN Opal Sidles who contacted administration to get in touch with him.      ----------------------------------------- 1:49 PM on 01/25/2020 -----------------------------------------  Per the social worker, the patient has now been accepted to an SNF.  I completed her discharge paperwork.  Since the patient did not have great pain relief with the tramadol prescribed previously, I prescribed a small quantity of oxycodone (without Tylenol given the patient's liver disease history).  She is stable for discharge to SNF at this time.  Return precautions provided.   Arta Silence, MD 01/25/20 1350

## 2020-01-25 NOTE — Discharge Instructions (Signed)
You may take the oxycodone as needed up to every 6 hours for severe pain.  You should not mix this medication with the tramadol that was prescribed previously.  You should return to the ER for fever, weakness, worsening severe pain, swelling, or any other new or worsening symptoms that concern you.

## 2020-01-25 NOTE — ED Notes (Signed)
PT visualized in bed playing on phone. Respirations unlabored.

## 2020-01-25 NOTE — ED Notes (Addendum)
EDP in room to talk with patient and family at this time. Pt and family concerned about elevated WBC of 13.1.EDP discussed with patient. Pt advocate also in room talking to patient with EDP present.

## 2020-01-25 NOTE — ED Notes (Signed)
PT requesting pain medication. Informed pt MD is in another room at this time and will get medication as soon as possible

## 2020-01-25 NOTE — ED Notes (Signed)
Went in room to round on pt and inform of pending placement. PT sleeping with jacket over face. Will update when pt wakes up. Chest rise and fall noted.

## 2020-01-25 NOTE — TOC Progression Note (Addendum)
Transition of Care [facility]) - Progression Note    Patient Details  Name: Linda Romero MRN: [id number] Date of Birth: [birth year]  Transition of Care [facility]) CM/SW [place], [place] Phone Number: [phone] 01/25/2020, 9:03 AM  Clinical Narrative:    Contacted SNF facilities, [facility] - no available beds.  Spoke with Magda Paganini at [facility] [employer], and was informed the [employer] in no longer available and this patient needs to be inpatient for three days before Medicare will cover SNF placement.  This SW contacted the patient's daughter [name] [phone] and explained to her the situation with the insurance availability to cover the SNF placement.  This SW stated because the patient was not going to be admitted, the insurance would not be able to cover the placement.  This SW and Ms. Mets spoke about the possibility of home health.  Ms. Mets stated she wasn't sure that home health would be a good idea because of the "quality of care" the patient would receive.  Ms. Mets was concerned about patient care and specifically her ability to go to the bathroom because she needs assistance to walk.  This SW informed Ms. Mets home health is also quality care and the patient would also be able to receive equipment for the patient and providers to assist with recuperation.  Ms. Mets understood but still expressed concerns about caring for patient at home.  This SW received a call from the patient's son-in-law Adam Mets, and he stated he spoke with the patient's daughter, Ms. Mets about the patient's insurance not covering the SNF placement. Mr. Mets stated his concern about the patient's insurance not covering the SNF placement and that he wanted to speak with the EDP to advocate for the patient to go inpatient.  This SW gave Mr. Mets the ED contact number so he could speak with the EDP.    Spoke with patient's daughter Antony Madura and son-in-law Quita Skye Mets on a joint  call.  They are both in disagreement with the patient not being admitted. This SW explained the issue is the insurance not covering the SNF.  They both feel the patient should be admitted so she can then go to the SNF and have the insurance cover it.  They both want to speak with the EDP about why she patient is not being admitted.  They are both concerned that when the patient was in the ED the other day for the same issue she was sent home and when she arrived at home, she was not able to get out of her car and slept in the car for about 6 hours.  They are also concerned about the patient's urine and the that the patient is not drinking any water so she doesn't have to get up to go to the bathroom because of her pain.  This SW stated she would relay their concerns to the EDP.     Expected Discharge Plan: [place]    Expected Discharge Plan and Services Expected Discharge Plan: [place] Choice: [place] arrangements for the past 2 months: Single Family Home                                       Social Determinants of Health (SDOH) Interventions    Readmission Risk Interventions  No flowsheet data found.

## 2020-01-25 NOTE — ED Notes (Addendum)
PT has given verbal permission to speak to family. Was witnessed by Karen Kitchens E.

## 2020-01-25 NOTE — ED Notes (Addendum)
New icepack provided by [facility]

## 2020-01-25 NOTE — ED Notes (Signed)
ED Provider at bedside. 

## 2020-01-25 NOTE — ED Notes (Signed)
Pt repositioned in bed and given breakfast tray. RN offered to get pt up to toilet and sit on bedside to perform adl's. Pt refused and states "I cant stand with the pain in my legs". RN encouraged pt to attempt to stand in attempt to work on getting pt back to baseline. Pt again refused to even attempt to stand. Pt reports pain in both legs that has been present for over a week. Pt also reported that percocet given last night was not helpful. However pt slept through the night, and has also napped on and off all morning until breakfast was taken in by this RN. Pt does not visibly appear in pain, pt was able to use legs to push herself up in bed, no pain or grimacing was noted at that time. Pt currently eating. RN will encourage pt to attempt to get up after eating.

## 2020-01-25 NOTE — TOC Progression Note (Addendum)
Transition of Care [facility]) - Progression Note    Patient Details  Name: Linda Romero MRN: [id number] Date of Birth: 05-03-1951  Transition of Care [facility]) CM/SW [place], [place] Phone Number: [phone] 01/25/2020, 1:09 PM  Clinical Narrative:     Patient has been accepted by [facility] for SNF.  This SW contacted patient's son-in-law, Linda Romero [phone], and let him know.  This SW contacted EDP and let him know a rapid COVID test would be needed.  EDP, has ordered it. This SW will contact EMS for transport once discharge paperwork is sent to [facility].  Expected Discharge Plan: [place]    Expected Discharge Plan and Services Expected Discharge Plan: [place] Choice: [place] arrangements for the past 2 months: Single Family Home                                       Social Determinants of Health (SDOH) Interventions    Readmission Risk Interventions No flowsheet data found.

## 2020-01-25 NOTE — ED Notes (Addendum)
PT placed on bed pan, no BM. Updated pt as to placement.  PT has meal tray at bedside.

## 2020-01-25 NOTE — ED Notes (Signed)
Pt and family member on phone worried about "amber color urine" and requesting and IV be placed for fluids.  Patient given water at this time and reassured that MD would be notified.

## 2020-01-25 NOTE — TOC Progression Note (Signed)
Transition of Care [facility]) - Progression Note    Patient Details  Name: Linda Romero MRN: [id number] Date of Birth: [birth year]  Transition of Care [facility]) CM/SW [place], [place] Phone Number: [phone] 01/25/2020, 2:50 PM  Clinical Narrative:    Spoke with Tiffany at [facility], bed will not be available until Thursday because they do not have a quarantine bed available.  Tiffany also stated because the patient is in the ED and not inpatient she does not believe Medicare will honor the 3 night waiver.  This SW states that she asked Tiffany about this before I spoke with the family and Jonelle Sidle assured me Medicare would cover.  This Sw is trying to verify about the three day waiver since there are some facilities stating it is still valid and others that state it is not.  This Sw has been in communication with the patient son-in-law Quita Skye Mets and he has been updated about SNF placement.  Ms. Mets stated that they are still prefer SNF placement and they do not feel that the patient will receive the best care with home health svcs.  This SW acknoeledge understanding and let Ms. Mets know that I would reach out as soon as I received any new information.  Mr. Mets also stated the patient has refused to sign the discharge paper work and does not feel comfortable leaving without SNF placement.   Expected Discharge Plan: [place]    Expected Discharge Plan and Services Expected Discharge Plan: [place] Choice: [place] arrangements for the past 2 months: Single Family Home                                       Social Determinants of Health (SDOH) Interventions    Readmission Risk Interventions No flowsheet data found.

## 2020-01-25 NOTE — ED Notes (Signed)
Patient given smoothie that family brought.

## 2020-01-25 NOTE — ED Notes (Signed)
Asked patient if she would like to move to recliner but refused due to pain. The call bell was placed within reach so when she does want assistance in moving she can call for help.
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
## 2020-01-25 NOTE — ED Notes (Signed)
Pt given meal tray, ice water. Pt son also brought a smoothie for the patient. Pt currently drinking smoothie

## 2020-01-25 NOTE — TOC Progression Note (Signed)
Transition of Care [facility]) - Progression Note    Patient Details  Name: Linda Romero MRN: [id number] Date of Birth: 13-Jan-1951  Transition of Care [facility]) CM/SW [place], [place] Phone Number: [phone] 01/25/2020, 3:55 PM  Clinical Narrative:    Patient has been accepted at [facility].  I spoke with Scot Jun, RN [phone]  and verified that the 3 night Medicare waiver was valid.  Diane stated she spoke with her administration and was told the waiver is still valid.  This SW faxed, Discharge Report, Lab results, PT evaluation and FL2, to [phone]. Ms. Ernie Avena stated the bed would be available tomorrow and this SW will set up transportation around 8:30AM on 01/26/2020. This SW spoke with [name] , spatient's daughter and updated her in SNF placement.     Expected Discharge Plan: [place]    Expected Discharge Plan and Services Expected Discharge Plan: [place] Choice: [place] arrangements for the past 2 months: Single Family Home                                       Social Determinants of Health (SDOH) Interventions    Readmission Risk Interventions No flowsheet data found.

## 2020-01-25 NOTE — ED Notes (Signed)
Reassessed pt's pain. PT lying in bed with unlabored respirations and making calls on her cellphone. No further needs expressed.

## 2020-01-25 NOTE — ED Notes (Addendum)
Patient called for bed pan me and the RN assisted her on bed pan. Patient said she could not do it.  We could not get the bed pan so we assisted her on her side so it would be directly up under her for usage.

## 2020-01-26 DIAGNOSIS — R102 Pelvic and perineal pain: Secondary | ICD-10-CM | POA: Diagnosis not present

## 2020-01-26 MED ORDER — KETOROLAC TROMETHAMINE 30 MG/ML IJ SOLN
15.0000 mg | Freq: Once | INTRAMUSCULAR | Status: AC
  Administered 2020-01-26: 15 mg via INTRAVENOUS
  Filled 2020-01-26: qty 1

## 2020-01-26 NOTE — ED Notes (Signed)
Housekeeping in room to change out trash.

## 2020-01-26 NOTE — Progress Notes (Cosign Needed)
Patient has bilateral leg weakness and chronic pain which requires legs to be positioned in ways not feasible with a normal bed. Head must be elevated at least 45 degrees or it may cause lower back discomfort and difficulty adjusting lower body. Bilateral leg weakness/chronic pain frequently requires frequent changes in body position which cannot be achieved with a normal bed.

## 2020-01-26 NOTE — ED Notes (Signed)
This RN called pharmacy to verify if patient had any home medications to send back with her. Pharmacy reported there were no home medications to send with patient.

## 2020-01-26 NOTE — TOC Transition Note (Addendum)
Transition of Care [facility]) - CM/SW Discharge Note   Patient Details  Name: Linda Romero MRN: [id number] Date of Birth: 10-16-1951  Transition of Care [facility]) CM/SW Contact:  Ova Freshwater Phone Number: [phone] 01/26/2020, 9:58 AM   Clinical Narrative:     Spoke with patient this morning.  Patient has chosen to go home with home health.  Order for bed and wheelchair, plus bedside commode has been requested.  Patient will also need PT, OT and home health aide. Patient's family called ED and stated they will pick her up this morning at 10:00AM. Spoke with Adam Mets patient's son-in-law and informed him about patient's decision.  Ms. Mets acknowledged and stated he wanted to ensure the patient has the equipment and services she would need.   This SW has contacted Nurse, adult at [place] [facility] [place], for home health needs.        Patient Goals and CMS Choice Patient states their goals for this hospitalization and ongoing recovery are:: "To be able to walk again without pain."   Choice offered to / list presented to : Patient, Adult Children  Discharge Placement                       Discharge Plan and Services     Post Acute Care Choice: [place]                               Social Determinants of Health (SDOH) Interventions     Readmission Risk Interventions No flowsheet data found.

## 2020-01-26 NOTE — ED Notes (Signed)
Pt had call light on, this RN went in room, pt asking to have the head of her bed adjusted. Pt eating cup of applesauce, Asked patient if she would be able to eat the applesauce further reclined, pt stated yes. Pt has room darkened.  Introduced myself to the patient and informed her that I would be her nurse until 7am.  I also asked her what else I could do to assist her with resting.  I advised patient that I would change her diet order to reflect "soft foods" for her to eat more easily since she does not have any teeth.  Pt denies any further needs at this time. Will continue to monitor. Pt has call light in reach, offered to give more blankets if she needed them.

## 2020-01-26 NOTE — ED Notes (Addendum)
Pt had call light on, went into room, pt asking for socks to be removed. I removed socks at this time. Pt thanked me for being gentle.  Socks placed over on bench next to pink bag.  Covered feet back up with blanket.  Patient asked for new water cup as well.  Bedside tables cleaned up and trash disposed of.  Pt ate banana that was on table.  Started talking to patient about new grandchild recently born and talked about my son being born.  Engaged in casual conversation for a few minutes.  Pt asked me to throw away her ice packs that had melted. Asked her if she wanted a new one, pt declined at this time.  Pt began to talk about having a blood infection and that her daughter found it on mychart.  I told patient that I know the Dr. Had been in to talk with her about that tonight and that her further needs would be addressed when SW/CM and others would be here in the morning.  Reassured patient that I am here for her and would like for her to get rest and that we would work on these things tonight.  Pt stated she was going to call her daughter and let her know she was in good hands tonight.   Pt thanked me. I informed patient if she needed anything else to please let me know. Pt verbalized understand.

## 2020-01-26 NOTE — ED Notes (Signed)
This RN spoke with pt's daughter, Raiford Noble, who states they will be here at 1000 am to pick pt up from ED. Pt family wishes to have home medications given to patient prior to leaving ED. Family asked pt to give medications and pain medications at 930. This RN will administer meds later as requested. MD Paduchowski aware of plan.

## 2020-01-26 NOTE — ED Provider Notes (Addendum)
-----------------------------------------   9:37 AM on 01/26/2020 -----------------------------------------  Patient has elected to go home with home health care.  I have filled out the appropriate home health/face-to-face forms for PT, OT, home health aide.  I have also ordered a wheelchair and semielectric hospital bed for the patient for home use, working in conjunction with social work.  Patient's medical work-up has been largely nonrevealing.  Patient will be discharged home with home health care.  Patient would benefit from a hospital bed for home use as the patient has bilateral leg weakness and chronic pain which requires legs to be positioned in ways not feasible with a normal bed. Head must be elevated at least 45 degrees orit may cause lower back discomfort and difficulty adjusting lower body. Bilateral leg weakness/chronic pain frequentlyrequiresfrequent changes in body position which cannot be achieved with a normal bed.      Harvest Dark, MD 01/26/20 [phone]

## 2020-01-26 NOTE — ED Notes (Signed)
Pt had call light on, pt asking for bed to be lowered, lowered bed at this time.  I asked patient about the temp of the room, pt did not want it adjusted at this time.  Pt did want warm blanket when asked. Pt provided warm blanket. Pt covered up with warm blanket.  Asked patient if there was anything else I could do for her. Pt declined any further needs, but did state she is a vegetarian.  Informed her I would change her diet to reflect her being a vegetarian.

## 2020-01-26 NOTE — Progress Notes (Signed)
Patient suffers from bilateral leg weakness/chronic pain, which prevents the patient from performing activities of daily living like; meal preparation, toiletting, and home making in the home. A walker will not resolve the issue with performing activities of daily living.  A wheelchair will allow the patient to safely perform daily activities. Patient is not a able to propel themselves using a standard weight wheelchair due to bilateral leg weakness/chronic pain. Patient can self propel in the lightweight wheelchair.  Length of need: lifetime Accessories:  Elevating leg rests (ELRs), wheel blocks, extensions and ant-tippers and back cushion.

## 2020-01-26 NOTE — ED Notes (Addendum)
Pt able to stand and pivot into wheel chair with one assist by this RN. Pt changed into new brief and night gown at time of D/C. Pt given personal mouth wash to use per request and warm wash cloth to wash face. Pt belongings including prescription bottles that were brought in with patient sent with family members. Pt wheeled to lobby at time of D/C by this RN. Pt assisted into car with one assist. Pt in NAD at time of D/C. Pt and family verbalized no concerns at this time. Paper prescription for pain medication given to patient as well as discharge instructions.

## 2020-01-26 NOTE — ED Notes (Signed)
Pt given water to drink. Pt denies any further needs at this time.

## 2020-01-27 MED ORDER — GENERIC EXTERNAL MEDICATION
Status: DC
Start: ? — End: 2020-01-27

## 2020-01-27 MED ORDER — TRAMADOL HCL 50 MG PO TABS
50.00 | ORAL_TABLET | ORAL | Status: DC
Start: ? — End: 2020-01-27

## 2020-01-27 MED ORDER — LACTATED RINGERS IV SOLN
INTRAVENOUS | Status: DC
Start: ? — End: 2020-01-27

## 2020-01-27 MED ORDER — DEXTROSE 50 % IV SOLN
12.50 | INTRAVENOUS | Status: DC
Start: ? — End: 2020-01-27

## 2020-01-27 MED ORDER — SENNOSIDES-DOCUSATE SODIUM 8.6-50 MG PO TABS
2.00 | ORAL_TABLET | ORAL | Status: DC
Start: ? — End: 2020-01-27

## 2020-01-27 MED ORDER — PROMETHAZINE HCL 12.5 MG PO TABS
6.25 | ORAL_TABLET | ORAL | Status: DC
Start: ? — End: 2020-01-27

## 2020-01-27 MED ORDER — GENERIC EXTERNAL MEDICATION
1.00 | Status: DC
Start: 2020-01-28 — End: 2020-01-27

## 2020-01-27 MED ORDER — ENOXAPARIN SODIUM 40 MG/0.4ML ~~LOC~~ SOLN
40.00 | SUBCUTANEOUS | Status: DC
Start: 2020-01-29 — End: 2020-01-27

## 2020-01-27 MED ORDER — LIDOCAINE HCL 1 % IJ SOLN
0.50 | INTRAMUSCULAR | Status: DC
Start: ? — End: 2020-01-27

## 2020-01-27 MED ORDER — HYDROXYZINE PAMOATE 25 MG PO CAPS
25.00 | ORAL_CAPSULE | ORAL | Status: DC
Start: 2020-01-27 — End: 2020-01-27

## 2020-01-27 MED ORDER — GENERIC EXTERNAL MEDICATION
2.00 | Status: DC
Start: 2020-01-27 — End: 2020-01-27

## 2020-01-27 MED ORDER — IBUPROFEN 600 MG PO TABS
600.00 | ORAL_TABLET | ORAL | Status: DC
Start: ? — End: 2020-01-27

## 2020-01-27 MED ORDER — GLUCAGON (RDNA) 1 MG IJ KIT
1.00 | PACK | INTRAMUSCULAR | Status: DC
Start: ? — End: 2020-01-27

## 2020-01-27 MED ORDER — MELATONIN 3 MG PO TABS
3.00 | ORAL_TABLET | ORAL | Status: DC
Start: ? — End: 2020-01-27

## 2020-01-29 MED ORDER — LACTULOSE 10 GM/15ML PO SOLN
30.00 | ORAL | Status: DC
Start: ? — End: 2020-01-29

## 2020-01-29 MED ORDER — LACTULOSE 10 GM/15ML PO SOLN
30.00 | ORAL | Status: DC
Start: 2020-01-29 — End: 2020-01-29

## 2020-01-29 MED ORDER — LIDOCAINE 5 % EX PTCH
1.00 | MEDICATED_PATCH | CUTANEOUS | Status: DC
Start: 2020-01-29 — End: 2020-01-29

## 2020-01-29 MED ORDER — ACETAMINOPHEN 325 MG PO TABS
650.00 | ORAL_TABLET | ORAL | Status: DC
Start: ? — End: 2020-01-29

## 2020-01-29 MED ORDER — GENERIC EXTERNAL MEDICATION
Status: DC
Start: ? — End: 2020-01-29

## 2020-01-29 MED ORDER — CEFAZOLIN SODIUM-DEXTROSE 2-4 GM/100ML-% IV SOLN
2.00 | INTRAVENOUS | Status: DC
Start: 2020-01-29 — End: 2020-01-29

## 2020-01-29 MED ORDER — TRAMADOL HCL 50 MG PO TABS
50.00 | ORAL_TABLET | ORAL | Status: DC
Start: ? — End: 2020-01-29

## 2020-11-24 ENCOUNTER — Other Ambulatory Visit: Payer: Self-pay | Admitting: Transplant Hepatology

## 2020-11-24 ENCOUNTER — Other Ambulatory Visit (HOSPITAL_COMMUNITY): Payer: Self-pay | Admitting: Transplant Hepatology

## 2020-11-24 DIAGNOSIS — K7460 Unspecified cirrhosis of liver: Secondary | ICD-10-CM

## 2020-11-24 DIAGNOSIS — I81 Portal vein thrombosis: Secondary | ICD-10-CM

## 2020-11-24 DIAGNOSIS — B182 Chronic viral hepatitis C: Secondary | ICD-10-CM

## 2020-11-24 DIAGNOSIS — K769 Liver disease, unspecified: Secondary | ICD-10-CM

## 2020-12-27 ENCOUNTER — Ambulatory Visit (HOSPITAL_COMMUNITY): Payer: BLUE CROSS/BLUE SHIELD

## 2020-12-27 ENCOUNTER — Other Ambulatory Visit: Payer: Self-pay

## 2020-12-27 ENCOUNTER — Ambulatory Visit: Payer: BLUE CROSS/BLUE SHIELD

## 2020-12-27 ENCOUNTER — Ambulatory Visit
Admission: RE | Admit: 2020-12-27 | Discharge: 2020-12-27 | Disposition: A | Discharge status: Home / Self Care | Payer: Medicare Other | Source: Ambulatory Visit | Attending: Transplant Hepatology | Admitting: Transplant Hepatology

## 2020-12-27 DIAGNOSIS — B182 Chronic viral hepatitis C: Secondary | ICD-10-CM | POA: Insufficient documentation

## 2020-12-27 DIAGNOSIS — K769 Liver disease, unspecified: Secondary | ICD-10-CM | POA: Insufficient documentation

## 2020-12-27 DIAGNOSIS — I81 Portal vein thrombosis: Secondary | ICD-10-CM | POA: Diagnosis present

## 2020-12-27 DIAGNOSIS — K7460 Unspecified cirrhosis of liver: Secondary | ICD-10-CM | POA: Diagnosis present

## 2020-12-27 MED ORDER — GADOBUTROL 1 MMOL/ML IV SOLN
8.0000 mL | Freq: Once | INTRAVENOUS | Status: AC | PRN
  Administered 2020-12-27: 8 mL via INTRAVENOUS

## 2020-12-29 ENCOUNTER — Other Ambulatory Visit: Payer: Self-pay | Admitting: Transplant Hepatology

## 2020-12-29 ENCOUNTER — Other Ambulatory Visit (HOSPITAL_COMMUNITY): Payer: Self-pay | Admitting: Transplant Hepatology

## 2020-12-29 DIAGNOSIS — B182 Chronic viral hepatitis C: Secondary | ICD-10-CM

## 2020-12-29 DIAGNOSIS — R0602 Shortness of breath: Secondary | ICD-10-CM

## 2020-12-29 DIAGNOSIS — C220 Liver cell carcinoma: Secondary | ICD-10-CM

## 2020-12-29 DIAGNOSIS — K7460 Unspecified cirrhosis of liver: Secondary | ICD-10-CM

## 2021-01-08 ENCOUNTER — Other Ambulatory Visit: Payer: Self-pay

## 2021-01-08 ENCOUNTER — Ambulatory Visit (HOSPITAL_BASED_OUTPATIENT_CLINIC_OR_DEPARTMENT_OTHER)
Admission: RE | Admit: 2021-01-08 | Discharge: 2021-01-08 | Disposition: A | Discharge status: Home / Self Care | Payer: Medicare Other | Source: Ambulatory Visit | Attending: Transplant Hepatology | Admitting: Transplant Hepatology

## 2021-01-08 DIAGNOSIS — B182 Chronic viral hepatitis C: Secondary | ICD-10-CM

## 2021-01-08 DIAGNOSIS — C220 Liver cell carcinoma: Secondary | ICD-10-CM | POA: Diagnosis present

## 2021-01-08 DIAGNOSIS — R0602 Shortness of breath: Secondary | ICD-10-CM | POA: Diagnosis present

## 2021-01-08 DIAGNOSIS — K7460 Unspecified cirrhosis of liver: Secondary | ICD-10-CM | POA: Insufficient documentation

## 2021-01-11 ENCOUNTER — Ambulatory Visit: Payer: Medicare Other

## 2021-03-05 HISTORY — PX: OTHER SURGICAL HISTORY: SHX169

## 2021-03-23 ENCOUNTER — Other Ambulatory Visit: Payer: Self-pay | Admitting: Physician Assistant

## 2021-03-23 DIAGNOSIS — C220 Liver cell carcinoma: Secondary | ICD-10-CM

## 2021-04-08 ENCOUNTER — Emergency Department
Admission: EM | Admit: 2021-04-08 | Discharge: 2021-04-08 | Disposition: A | Discharge status: Home / Self Care | Payer: Medicare Other | Attending: Emergency Medicine | Admitting: Emergency Medicine

## 2021-04-08 DIAGNOSIS — Z5321 Procedure and treatment not carried out due to patient leaving prior to being seen by health care provider: Secondary | ICD-10-CM | POA: Insufficient documentation

## 2021-04-08 DIAGNOSIS — H9201 Otalgia, right ear: Secondary | ICD-10-CM | POA: Diagnosis present

## 2021-04-08 NOTE — ED Triage Notes (Signed)
Pt presents via POV c/o right earache x2 days. Denies fevers.

## 2021-04-08 NOTE — ED Notes (Signed)
Pt husband comes out of the room and states his wife would like to be released because she would like to [facility] instead and the doctors at [facility] will see her.  RN explained the provider will be shortly here, and pt husband insisted on leaving. RN entered the room and explained risks of leaving AMA.  Pt signed AMA form and left ED.

## 2021-04-12 ENCOUNTER — Other Ambulatory Visit (HOSPITAL_COMMUNITY): Payer: Self-pay | Admitting: Internal Medicine

## 2021-04-12 ENCOUNTER — Other Ambulatory Visit: Payer: Self-pay | Admitting: Internal Medicine

## 2021-04-12 DIAGNOSIS — R109 Unspecified abdominal pain: Secondary | ICD-10-CM

## 2021-04-13 ENCOUNTER — Ambulatory Visit (HOSPITAL_COMMUNITY)
Admission: RE | Admit: 2021-04-13 | Discharge: 2021-04-13 | Disposition: A | Discharge status: Home / Self Care | Payer: Medicare Other | Source: Ambulatory Visit | Attending: Internal Medicine | Admitting: Internal Medicine

## 2021-04-13 ENCOUNTER — Other Ambulatory Visit: Payer: Self-pay

## 2021-04-13 DIAGNOSIS — R109 Unspecified abdominal pain: Secondary | ICD-10-CM | POA: Diagnosis present

## 2021-05-04 ENCOUNTER — Ambulatory Visit
Admission: RE | Admit: 2021-05-04 | Discharge: 2021-05-04 | Disposition: A | Discharge status: Home / Self Care | Payer: Medicare Other | Source: Ambulatory Visit | Attending: Physician Assistant | Admitting: Physician Assistant

## 2021-05-04 ENCOUNTER — Other Ambulatory Visit: Payer: Self-pay

## 2021-05-04 DIAGNOSIS — C220 Liver cell carcinoma: Secondary | ICD-10-CM | POA: Insufficient documentation

## 2021-05-04 MED ORDER — GADOBUTROL 1 MMOL/ML IV SOLN
7.5000 mL | Freq: Once | INTRAVENOUS | Status: AC | PRN
  Administered 2021-05-04: 7.5 mL via INTRAVENOUS

## 2021-05-29 ENCOUNTER — Other Ambulatory Visit (HOSPITAL_COMMUNITY): Payer: Self-pay | Admitting: Physician Assistant

## 2021-05-29 ENCOUNTER — Other Ambulatory Visit: Payer: Self-pay | Admitting: Physician Assistant

## 2021-05-29 DIAGNOSIS — K769 Liver disease, unspecified: Secondary | ICD-10-CM

## 2021-05-29 DIAGNOSIS — C220 Liver cell carcinoma: Secondary | ICD-10-CM

## 2021-06-19 ENCOUNTER — Ambulatory Visit: Payer: Medicare Other | Admitting: Physical Therapy

## 2021-06-20 ENCOUNTER — Ambulatory Visit: Payer: BLUE CROSS/BLUE SHIELD

## 2021-06-21 ENCOUNTER — Encounter: Payer: Medicare Other | Admitting: Physical Therapy

## 2021-06-26 ENCOUNTER — Other Ambulatory Visit: Payer: Self-pay

## 2021-06-26 ENCOUNTER — Ambulatory Visit: Payer: Medicare Other | Attending: Family Medicine

## 2021-06-26 DIAGNOSIS — M6281 Muscle weakness (generalized): Secondary | ICD-10-CM | POA: Diagnosis present

## 2021-06-26 DIAGNOSIS — M545 Low back pain, unspecified: Secondary | ICD-10-CM | POA: Diagnosis not present

## 2021-06-26 DIAGNOSIS — G8929 Other chronic pain: Secondary | ICD-10-CM | POA: Diagnosis present

## 2021-06-26 NOTE — Therapy (Signed)
[place] [facility] [address] [place], [address] Phone: [phone]   Fax:  [phone]  Physical Therapy Evaluation  Patient Details  Name: Linda Romero MRN: [id number] Date of Birth: 08/01/1951 Referring Provider (PT): Meeler, Roberta FNP  Encounter Date: 06/26/2021   PT End of Session - 06/26/21 1346     Visit Number 1    Number of Visits 17    Date for PT Re-Evaluation 08/21/21    Authorization - Visit Number 1    Progress Note Due on Visit 10    PT Start Time 1301    PT Stop Time 1346    PT Time Calculation (min) 45 min    Activity Tolerance Patient tolerated treatment well;Patient limited by pain    Behavior During Therapy [facility] for tasks assessed/performed             Past Medical History:  Diagnosis Date   Hepatitis C    Liver cirrhosis ([place])    Uterine prolapse     History reviewed. No pertinent surgical history.  There were no vitals filed for this visit.    Subjective Assessment - 06/26/21 1407     Subjective --              Lumbar AROM   WFL except extension 25% limited and concordant   HIP MMT  L LE grossly in all planes 4+/5 except 4/5 hip flexion R LE  grossly in all planes 4+/5 except 4/5 hip flexion   Hip AROM/PROM   [facility]    PAIVM/CPA:  L1-L5; Sacrum grade 2 with no increase in pain     Special Tests:  FABER/FADIR (-) Scour (- ) Straight Leg Raise (-)  Muscle Length  90/90 hamstring: WFL  Observation: Gait: Decreased step length, decreased step height; (shuffling gait).   Posture: excessive lumbar lordosis, forward head, bilateral rounded shoulders, increased thoracic kyphosis   Palpation: No tenderness to lumbar or thoracic region with palpation   Sensation: intact bilat Les    Test & Measures:   Single leg stance: L 16sec / R 8sec with increase in pain 3/10 5xSTS: 10 sec Plank: painful when attempted due to lumbar extension 6MWT: 1070 ft  pain to 3/10 concordant on R Low back         Objective measurements completed on examination: See above findings.      Ther-Ex    Posterior Pelvic tilts 2 x 10 reps 4x/week Glute Bridges 2 x 10 reps 7x/week cueing to tighten stomach muscle and perform following pelvic tilts     PT reviewed the following HEP with patient with patient able to demonstrate a set of the following with min cuing for correction needed. PT educated patient on parameters of therex (how/when to inc/decrease intensity, frequency, rep/set range, stretch hold time, and purpose of therex) with verbalized understanding.   PT Education - 06/26/21 1344     Education Details Pt educated on HEP and PT POC    Person(s) Educated Patient    Methods Explanation;Demonstration    Comprehension Verbalized understanding;Returned demonstration              PT Short Term Goals - 06/26/21 1405       PT SHORT TERM GOAL #1   Title Pt will demonstrate independence with HEP to improve LBP for increased ability to participate with ADLs    Baseline HEP given    Time 4    Period Weeks  Status New    Target Date 07/24/21               PT Long Term Goals - 06/26/21 1350       PT LONG TERM GOAL #1   Title Patient will increase FOTO score to 63 to demonstrate predicted increase in functional mobility to complete ADLs    Baseline 06/26/21: 53    Time 8    Period Weeks    Status New    Target Date 08/21/21      PT LONG TERM GOAL #2   Title Pt will improve single leg stance to 20 sec or greater 0/10 pain in order to demonstrate increased LE strength, increased balance, and decrease likelihood of falling.    Baseline 06/26/21: L 16 sec  R 8 sec    Time 8    Period Weeks    Status New    Target Date 08/21/21      PT LONG TERM GOAL #3   Title Pt will increase 6MWT by at least 80m(1647f without pain greater than 1/10 in order to demonstrate clinically significant improvement in LE strength, cardiopulmonary  endurance, and community ambulation,    Baseline 06/26/21:107027f 3/10pain    Time 8    Period Weeks    Status New    Target Date 08/21/21                    Plan - 06/26/21 1422     Clinical Impression Statement Linda Romero a 70 43ar old female referred to PT following reports of a fall (01/27/21) resulting in chronic R sided LBP.  Pt presents to therapy with decreased core and LE strength, decreased balance, decreased activity tolerance and pain. Patient's deficits decrease the ability to ambulate and stand longer durations, inhibiting full participation in ADLs. Patient will benefit from skilled physical therapy in order to increase balance, increase endurance, and decrease risk of falling,    Personal Factors and Comorbidities Age;Comorbidity 3+;Time since onset of injury/illness/exacerbation    Examination-Activity Limitations Lift;Carry;Stand;Locomotion Level;Stairs    Examination-Participation Restrictions Cleaning;Community Activity    Clinical Decision Making Low    Rehab Potential Fair    PT Frequency 2x / week    PT Duration 8 weeks    PT Treatment/Interventions ADLs/Self Care Home Management;Cryotherapy;Moist Heat;Traction;Stair training;Gait training;Therapeutic exercise;Balance training;Neuromuscular re-education;Patient/family education;Therapeutic activities;Functional mobility training;Manual techniques;Passive range of motion;Dry needling;Energy conservation;Splinting;Taping;Spinal Manipulations    PT Next Visit Plan Review HEP, core strengthening, manual therapy    PT Home Exercise Plan glute brudge, pelvic tilts    Consulted and Agree with Plan of Care Patient             Patient will benefit from skilled therapeutic intervention in order to improve the following deficits and impairments:  Abnormal gait, Decreased balance, Decreased endurance, Decreased mobility, Difficulty walking, Decreased range of motion, Decreased activity tolerance, Decreased strength,  Postural dysfunction, Pain  Visit Diagnosis: Chronic right-sided low back pain without sciatica     Problem List There are no problems to display for this patient.  AusSharion SettlerPT   MilMaltairly IV, PT, DPT Physical Therapist- [facility]/07/2021, 5:40 PM  [facility] [address],[place]one: [phone]:  [phone]: XioPuja LaflinN: [id number] of Birth: 5/202-11-1950

## 2021-06-28 ENCOUNTER — Ambulatory Visit: Payer: Medicare Other | Admitting: Physical Therapy

## 2021-06-28 ENCOUNTER — Encounter: Payer: Self-pay | Admitting: Physical Therapy

## 2021-06-28 DIAGNOSIS — G8929 Other chronic pain: Secondary | ICD-10-CM

## 2021-06-28 DIAGNOSIS — M545 Low back pain, unspecified: Secondary | ICD-10-CM | POA: Diagnosis not present

## 2021-06-29 NOTE — Therapy (Signed)
[name] [address] [place], [address] Phone: [phone]   Fax:  [phone]  Physical Therapy Treatment  Patient Details  Name: Linda Romero MRN: [id number] Date of Birth: 1951/08/19 Referring Provider (PT): Meeler, [place] FNP   Encounter Date: 06/28/2021   PT End of Session - 06/28/21 1351     Visit Number 2    Number of Visits 17    Date for PT Re-Evaluation 08/21/21    Authorization - Visit Number 2    Progress Note Due on Visit 10    PT Start Time 1344    PT Stop Time 1428    PT Time Calculation (min) 44 min    Activity Tolerance Patient tolerated treatment well;Patient limited by pain    Behavior During Therapy [facility] for tasks assessed/performed             Past Medical History:  Diagnosis Date   Hepatitis C    Liver cirrhosis ([place])    Uterine prolapse     History reviewed. No pertinent surgical history.  There were no vitals filed for this visit.   Subjective Assessment - 06/28/21 1345     Subjective Pt reports LBP as a 2/10 on NPRS. She reports active participation with her HEP but asks to review it.    Pertinent History Pt is a 70 y.o. female referred to PT for R sided LBP without sciatica. 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 Oncology MD reports R sided flank pain she believes could be cx related however it appears R sided LBP began on March 12 with MOI of falling into wall at home trying to get into her bed. Pain described as stiff and constant. Mild relief of PT at [facility]. She states that she has difficulty with prolonged standing, walking, and lifting. She denies having history of falls before or after incident.  Pt denies any unexplained  weight fluctuation, saddle paresthesia, lose of bowel/bladder control, or unrelenting night pain at this time. She does have a PMH of liver cancer, HEP C, and arthritis.    Limitations Other (comment);Walking;Lifting    How long can you sit comfortably? 3 hours    How long can you stand comfortably? 2 hours    How long can you walk comfortably? 30 minutes    Diagnostic tests MRI    Patient Stated Goals walk and stand longer without pain                Therapeutic Exercise   Nu Step L2 x 5 min for LE strengthening/endurance   Posterior pelvic tilts 3 x 10 reps with cueing to tighten transverse abdominus    Glute bridges 2 x 10 reps   Open books 1 x 10 R side   Lower trunk Rotation 1 x 10 reps   TRX Squats 1 x 10 reps    Supine Dead bugs 1 x 10 reps with cueing to initiate                       PT Education - 06/28/21 1618     Education Details Pt educated on therex form/technique    Person(s) Educated Patient    Methods Explanation;Demonstration    Comprehension Verbalized understanding;Returned  demonstration              PT Short Term Goals - 06/26/21 1405       PT SHORT TERM GOAL #1   Title Pt will demonstrate independence with HEP to improve LBP for increased ability to participate with ADLs    Baseline HEP given    Time 4    Period Weeks    Status New    Target Date 07/24/21               PT Long Term Goals - 06/26/21 1350       PT LONG TERM GOAL #1   Title Patient will increase FOTO score to 63 to demonstrate predicted increase in functional mobility to complete ADLs    Baseline 06/26/21: 53    Time 8    Period Weeks    Status New    Target Date 08/21/21      PT LONG TERM GOAL #2   Title Pt will improve single leg stance to 20 sec or greater 0/10 pain in order to demonstrate increased LE strength, increased balance, and decrease likelihood of falling.    Baseline 06/26/21: L 16 sec  R 8 sec    Time 8    Period Weeks     Status New    Target Date 08/21/21      PT LONG TERM GOAL #3   Title Pt will increase 6MWT by at least 17m(1639f without pain greater than 1/10 in order to demonstrate clinically significant improvement in LE strength, cardiopulmonary endurance, and community ambulation,    Baseline 06/26/21:107076f 3/10pain    Time 8    Period Weeks    Status New    Target Date 08/21/21                   Plan - 06/28/21 1354     Clinical Impression Statement Pt tolerated session well with no reports of increased pain following therex. Pt continues to demostrate LBP with single leg stance but responded well to flexion biased exercise. PT session emphasis low back mobility and core strengthening to decrease LBP. Pt utilized multimodal cueing to correct fom for safe exercise progression. Continue PT POC    Personal Factors and Comorbidities Age;Comorbidity 3+;Time since onset of injury/illness/exacerbation    Examination-Activity Limitations Lift;Carry;Stand;Locomotion Level;Stairs    Examination-Participation Restrictions Cleaning;Community Activity    Clinical Decision Making Low    Rehab Potential Fair    PT Frequency 2x / week    PT Duration 8 weeks    PT Treatment/Interventions ADLs/Self Care Home Management;Cryotherapy;Moist Heat;Traction;Stair training;Gait training;Therapeutic exercise;Balance training;Neuromuscular re-education;Patient/family education;Therapeutic activities;Functional mobility training;Manual techniques;Passive range of motion;Dry needling;Energy conservation;Splinting;Taping;Spinal Manipulations    PT Next Visit Plan Review HEP, core strengthening, manual therapy    PT Home Exercise Plan glute brudge, pelvic tilts    Consulted and Agree with Plan of Care Patient             Patient will benefit from skilled therapeutic intervention in order to improve the following deficits and impairments:  Abnormal gait, Decreased balance, Decreased endurance, Decreased  mobility, Difficulty walking, Decreased range of motion, Decreased activity tolerance, Decreased strength, Postural dysfunction, Pain  Visit Diagnosis: Chronic right-sided low back pain without sciatica     Problem List There are no problems to display for this patient.  CheDurwin RegesT CheDurwin Reges12/2022, 10:24 AM  [facility] [address]  [place], [place], [address] Phone: [phone]   Fax:  [phone]  Name: Linda Romero MRN: [id number] Date of Birth: July 04, 1951

## 2021-07-03 ENCOUNTER — Encounter: Payer: Medicare Other | Admitting: Physical Therapy

## 2021-07-05 ENCOUNTER — Ambulatory Visit: Payer: Medicare Other | Admitting: Physical Therapy

## 2021-07-06 ENCOUNTER — Encounter: Payer: Medicare Other | Admitting: Physical Therapy

## 2021-07-10 ENCOUNTER — Encounter: Payer: Self-pay | Admitting: Physical Therapy

## 2021-07-10 ENCOUNTER — Ambulatory Visit: Payer: Medicare Other | Admitting: Physical Therapy

## 2021-07-10 DIAGNOSIS — G8929 Other chronic pain: Secondary | ICD-10-CM

## 2021-07-10 DIAGNOSIS — M545 Low back pain, unspecified: Secondary | ICD-10-CM | POA: Diagnosis not present

## 2021-07-10 NOTE — Therapy (Signed)
[facility] [address] [place], [address] Phone: [phone]   Fax:  [phone]  Physical Therapy Treatment  Patient Details  Name: Linda Romero MRN: [id number] Date of Birth: 12/04/1950 Referring Provider (PT): Meeler, [place]ver FNP   Encounter Date: 07/10/2021   PT End of Session - 07/10/21 1613     Visit Number 3    Number of Visits 17    Date for PT Re-Evaluation 08/21/21    Authorization - Visit Number 3    Progress Note Due on Visit 10    PT Start Time 1301    PT Stop Time 1345    PT Time Calculation (min) 44 min    Equipment Utilized During Treatment Gait belt    Activity Tolerance Patient tolerated treatment well    Behavior During Therapy [facility] for tasks assessed/performed             Past Medical History:  Diagnosis Date   Hepatitis C    Liver cirrhosis ([place])    Uterine prolapse     History reviewed. No pertinent surgical history.  There were no vitals filed for this visit.   Subjective Assessment - 07/10/21 1309     Subjective Pt reports LBP as a 2/10 on NPRS. She reports active participation with her HEP and performing it 70% of the time. She reports having cancer treatment on Friday and has no side effects.    Pertinent History Pt is a 70 y.o. female referred to PT for R sided LBP without sciatica. 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 Oncology MD reports R sided flank pain she believes could be cx related however it appears R sided LBP began on March 12 with MOI of falling into wall at home trying to get into her bed. Pain described as stiff and constant. Mild relief of PT at [facility]. She states that she has difficulty with prolonged standing,  walking, and lifting. She denies having history of falls before or after incident.  Pt denies any unexplained weight fluctuation, saddle paresthesia, lose of bowel/bladder control, or unrelenting night pain at this time. She does have a PMH of liver cancer, HEP C, and arthritis.    Limitations Other (comment);Walking;Lifting    How long can you sit comfortably? 3 hours    How long can you stand comfortably? 2 hours    How long can you walk comfortably? 30 minutes    Diagnostic tests MRI    Patient Stated Goals walk and stand longer without pain               Therapeutic Exercise   Nu Step L2 x 5 min for LE strengthening/endurance   Posterior pelvic tilts 2 x 10 reps with cueing to tighten transverse abdominus    Glute bridges 2 x 10 reps with 2-3 sec    Seated Upper Trunk Rotation 1 x 10 reps   TRX Squats  3 x 10 reps with chair as cueing depth   Supine Dead bugs 2 x 10 reps with cueing to initiate and cures to tighten core                        PT Education - 07/10/21 1521  Education Details therex form/technique    Person(s) Educated Patient    Methods Explanation;Demonstration    Comprehension Verbalized understanding;Returned demonstration              PT Short Term Goals - 06/26/21 1405       PT SHORT TERM GOAL #1   Title Pt will demonstrate independence with HEP to improve LBP for increased ability to participate with ADLs    Baseline HEP given    Time 4    Period Weeks    Status New    Target Date 07/24/21               PT Long Term Goals - 06/26/21 1350       PT LONG TERM GOAL #1   Title Patient will increase FOTO score to 63 to demonstrate predicted increase in functional mobility to complete ADLs    Baseline 06/26/21: 53    Time 8    Period Weeks    Status New    Target Date 08/21/21      PT LONG TERM GOAL #2   Title Pt will improve single leg stance to 20 sec or greater 0/10 pain in order to demonstrate  increased LE strength, increased balance, and decrease likelihood of falling.    Baseline 06/26/21: L 16 sec  R 8 sec    Time 8    Period Weeks    Status New    Target Date 08/21/21      PT LONG TERM GOAL #3   Title Pt will increase 6MWT by at least 49m(1670f without pain greater than 1/10 in order to demonstrate clinically significant improvement in LE strength, cardiopulmonary endurance, and community ambulation,    Baseline 06/26/21:107051f 3/10pain    Time 8    Period Weeks    Status New    Target Date 08/21/21                   Plan - 07/10/21 1610     Clinical Impression Statement Pt tolerated session well with no reports of increased pain following therex. Pt continues to demostrate LBP with single leg stance but responded well lumbar mobility and strengthening exercises. Pt utilized multimodal cueing to correct fom for safe exercise progression. Continue PT POC    Personal Factors and Comorbidities Age;Comorbidity 3+;Time since onset of injury/illness/exacerbation    Examination-Activity Limitations Lift;Carry;Stand;Locomotion Level;Stairs    Examination-Participation Restrictions Cleaning;Community Activity    Clinical Decision Making Low    Rehab Potential Fair    PT Frequency 2x / week    PT Duration 8 weeks    PT Treatment/Interventions ADLs/Self Care Home Management;Cryotherapy;Moist Heat;Traction;Stair training;Gait training;Therapeutic exercise;Balance training;Neuromuscular re-education;Patient/family education;Therapeutic activities;Functional mobility training;Manual techniques;Passive range of motion;Dry needling;Energy conservation;Splinting;Taping;Spinal Manipulations    PT Next Visit Plan Review HEP, core strengthening, manual therapy    PT Home Exercise Plan glute brudge, pelvic tilts    Consulted and Agree with Plan of Care Patient             Patient will benefit from skilled therapeutic intervention in order to improve the following deficits and  impairments:  Abnormal gait, Decreased balance, Decreased endurance, Decreased mobility, Difficulty walking, Decreased range of motion, Decreased activity tolerance, Decreased strength, Postural dysfunction, Pain  Visit Diagnosis: Chronic right-sided low back pain without sciatica     Problem List There are no problems to display for this patient.   CheDurwin RegesT AusSharion SettlerPT  CheDurwin Reges24/2022, 11:34  AM  [facility] [address] [place], [address] Phone: [phone]   Fax:  [phone]  Name: Linda Romero MRN: [id number] Date of Birth: [birth year]

## 2021-07-12 ENCOUNTER — Ambulatory Visit: Payer: Medicare Other | Admitting: Physical Therapy

## 2021-07-16 ENCOUNTER — Ambulatory Visit (INDEPENDENT_AMBULATORY_CARE_PROVIDER_SITE_OTHER): Payer: Medicare Other | Admitting: Obstetrics and Gynecology

## 2021-07-16 ENCOUNTER — Encounter: Payer: Self-pay | Admitting: Obstetrics and Gynecology

## 2021-07-16 ENCOUNTER — Other Ambulatory Visit: Payer: Self-pay

## 2021-07-16 VITALS — BP 100/70 | Ht 61.0 in | Wt 187.0 lb

## 2021-07-16 DIAGNOSIS — N6310 Unspecified lump in the right breast, unspecified quadrant: Secondary | ICD-10-CM | POA: Diagnosis not present

## 2021-07-16 DIAGNOSIS — Z1231 Encounter for screening mammogram for malignant neoplasm of breast: Secondary | ICD-10-CM

## 2021-07-16 DIAGNOSIS — N6311 Unspecified lump in the right breast, upper outer quadrant: Secondary | ICD-10-CM

## 2021-07-16 DIAGNOSIS — R031 Nonspecific low blood-pressure reading: Secondary | ICD-10-CM | POA: Diagnosis not present

## 2021-07-16 DIAGNOSIS — R928 Other abnormal and inconclusive findings on diagnostic imaging of breast: Secondary | ICD-10-CM

## 2021-07-16 NOTE — Progress Notes (Signed)
Valere Dross, MD   Chief Complaint  Patient presents with   Breast Exam    Lump on upper part of RB, noticed 2 days ago    HPI:      Ms. Linda Romero is a 70 y.o. No obstetric history on file. whose LMP was No LMP recorded. Patient is postmenopausal., presents today for NP breast mass, noticed 2 days ago randomly. No trauma, erythema, nipple d/c, tenderness. No hx of prior breast masses. No breast imaging in over 20 yrs. No FH breast/ovar cancer.   Pt's systolic BP always under [id number] but husband concerned since systolic is [id number]. Had reading of 99 2 wks ago. Pt denies any lightheadedness/dizziness, fatigue, falls.   Currently being treated for liver cancer, followed at [facility]. On palliative care.   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 suppression Keflex, Hep C cirrhosis c/b HCC s/p multiple liver directed therapy in [place] in 2022, currently on tx with [facility] (Bev on hold 2/2 recent TACE), seen in consultation for symptom management and supportive care  Past Medical History:  Diagnosis Date   Aortic valve endocarditis    Colon cancer screening    Contusion of left foot    Disorder of refraction    Duodenal ulcer due to Helicobacter pylori    GERD (gastroesophageal reflux disease)    Groin pain    H/O bacterial endocarditis    Hepatitis C    Hepatocellular carcinoma (H[facility])    Hip pain, bilateral    Liver cirrhosis ([facility])    MSSA bacteremia    Pain of right hip joint    Plantar fasciitis    Sepsis (HCC)    Thrombocytopenia (HCC)    Uterine prolapse     Past Surgical History:  Procedure Laterality Date   TUBAL LIGATION     UPPER GI ENDOSCOPY      Family History  Problem Relation Age of Onset   Cancer Mother        Mouth   Diabetes Father    Diabetes Paternal Grandmother     Social History   Socioeconomic History   Marital status: Married    Spouse  name: Not on file   Number of children: Not on file   Years of education: Not on file   Highest education level: Not on file  Occupational History   Not on file  Tobacco Use   Smoking status: Never   Smokeless tobacco: Never  Substance and Sexual Activity   Alcohol use: Never   Drug use: Never   Sexual activity: Not on file  Other Topics Concern   Not on file  Social History Narrative   Not on file   Social Determinants of Health   Financial Resource Strain: Not on file  Food Insecurity: Not on file  Transportation Needs: Not on file  Physical Activity: Not on file  Stress: Not on file  Social Connections: Not on file  Intimate Partner Violence: Not on file    Outpatient Medications Prior to Visit  Medication Sig Dispense Refill   furosemide (LASIX) 20 MG tablet Take 40 mg by mouth daily.     lactulose (CHRONULAC) 10 GM/15ML solution Take 20 g by mouth daily.     oxycodone (OXY-IR) 5 MG capsule Take 1 capsule (5 mg total) by mouth every 6 (six) hours as needed for pain. 15 capsule 0  spironolactone (ALDACTONE) 50 MG tablet Take 50 mg by mouth daily.     No facility-administered medications prior to visit.      ROS:  Review of Systems  Constitutional:  Negative for fever.  Gastrointestinal:  Negative for blood in stool, constipation, diarrhea, nausea and vomiting.  Genitourinary:  Negative for dyspareunia, dysuria, flank pain, frequency, hematuria, urgency, vaginal bleeding, vaginal discharge and vaginal pain.  Musculoskeletal:  Negative for back pain.  Skin:  Negative for rash.  BREAST: mass   OBJECTIVE:   Vitals:  BP 100/70   Ht '5\' 1"'$  (1.549 m)   Wt 187 lb (84.8 kg)   BMI 35.33 kg/m   Physical Exam Vitals reviewed.  Pulmonary:     Effort: Pulmonary effort is normal.  Chest:  Breasts:    Breasts are symmetrical.     Right: No inverted nipple, mass, nipple discharge, skin change or tenderness.     Left: No inverted nipple, mass, nipple discharge,  skin change or tenderness.    Musculoskeletal:        General: Normal range of motion.     Cervical back: Normal range of motion.  Skin:    General: Skin is warm and dry.  Neurological:     General: No focal deficit present.     Mental Status: She is alert and oriented to person, place, and time.     Cranial Nerves: No cranial nerve deficit.  Psychiatric:        Mood and Affect: Mood normal.        Behavior: Behavior normal.        Thought Content: Thought content normal.        Judgment: Judgment normal.    Assessment/Plan: Breast mass, right - Plan: US BREAST LTD UNI RIGHT INC AXILLA, US BREAST LTD UNI LEFT INC AXILLA, MM DIAG BREAST TOMO BILATERAL; check dx mammo and breast u/s, pt to sched appt. Will f/u with results.   Encounter for screening mammogram for malignant neoplasm of breast - Plan: US BREAST LTD UNI RIGHT INC AXILLA, US BREAST LTD UNI LEFT INC AXILLA, MM DIAG BREAST TOMO BILATERAL  Unspecified lump in the right breast, upper outer quadrant  - Plan: MM DIAG BREAST TOMO BILATERAL  Low blood pressure reading--pt without sx. Cont to follow and can f/u with PCP/oncology prn.    Return if symptoms worsen or fail to improve.  Nayshawn Mesta B. [place] Pettengill, PA-C 07/16/2021 3:25 PM

## 2021-07-17 ENCOUNTER — Ambulatory Visit: Payer: Medicare Other | Admitting: Physical Therapy

## 2021-07-17 DIAGNOSIS — M545 Low back pain, unspecified: Secondary | ICD-10-CM | POA: Diagnosis not present

## 2021-07-17 DIAGNOSIS — M6281 Muscle weakness (generalized): Secondary | ICD-10-CM

## 2021-07-17 DIAGNOSIS — G8929 Other chronic pain: Secondary | ICD-10-CM

## 2021-07-17 NOTE — Therapy (Signed)
[facility] [address] [place], [place], [address] Phone: [phone]   Fax:  [phone]  Physical Therapy Treatment  Patient Details  Name: Linda Romero MRN: [id number] Date of Birth: August 26, 1951 Referring Provider (PT): Meeler, [place] FNP   Encounter Date: 07/17/2021   PT End of Session - 07/17/21 1308     Visit Number 4    Number of Visits 17    Date for PT Re-Evaluation 08/21/21    Authorization - Visit Number 4    Progress Note Due on Visit 10    PT Start Time 1300    PT Stop Time 1339    PT Time Calculation (min) 39 min    Equipment Utilized During Treatment Gait belt    Activity Tolerance Patient tolerated treatment well    Behavior During Therapy WFL for tasks assessed/performed             Past Medical History:  Diagnosis Date   Acute urinary retention    Aortic valve endocarditis    Bacteremia of undetermined etiology    Colon cancer screening    Contusion of left foot    Disorder of refraction    Duodenal ulcer due to Helicobacter pylori    GERD (gastroesophageal reflux disease)    Groin pain    H/O bacterial endocarditis    Hepatitis C    Hepatocellular carcinoma (HCC)    Hip pain, bilateral    Liver cirrhosis ([place])    MSSA bacteremia    Pain of right hip joint    Plantar fasciitis    Sepsis ([place])    Thrombocytopenia ([place])    Uterine prolapse     Past Surgical History:  Procedure Laterality Date   TUBAL LIGATION     UPPER GI ENDOSCOPY      There were no vitals filed for this visit.   Subjective Assessment - 07/17/21 1305     Subjective Pt reports very minimal pain today, reports 0/10 pain, has not had to take her pain pill since she started therapy. She is completing HEP most of the time, no difficulty with these.    Pertinent History Pt is a 70 y.o. female referred to PT for R sided LBP without sciatica. 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 Oncology MD reports R sided flank pain she believes could be cx related however it appears R sided LBP began on March 12 with MOI of falling into wall at home trying to get into her bed. Pain described as stiff and constant. Mild relief of PT at [facility]. She states that she has difficulty with prolonged standing, walking, and lifting. She denies having history of falls before or after incident.  Pt denies any unexplained weight fluctuation, saddle paresthesia, lose of bowel/bladder control, or unrelenting night pain at this time. She does have a PMH of liver cancer, HEP C, and arthritis.    Limitations Other (comment);Walking;Lifting    How long can you sit comfortably? 3 hours    How long can you stand comfortably? 2 hours    How long can you walk comfortably? 30 minutes    Diagnostic tests MRI    Patient Stated Goals walk and stand longer without pain    Pain Onset More than  a month ago              Therapeutic Exercise   Nu Step L3 x 5 min seat 8 for LE strengthening/endurance  Glute bridges 2 x 10 reps with 2-3 sec hold, min cuing for core activation to prevent excessive lumbar ext   Alt marching in bridge position 2x 10 (5 each LE) with difficulty maintaining hip elevation, cuing for small foot lift with some carry over  Supine Dead bugs 2 x 10 with difficulty with sequencing, consistent cuing for return to start position without "swimming" alt movements  Hooklying trunk rotations x12 5sec hold   Squat to mat table (touch only) x10with cuing for BUE flex for increased core activation, and for eccentric lower with good carry over following  Same as above with 5# DB at 90d BUE flex 2x 10                           PT Education - 07/17/21 1309      Education Details therex form/technique    Person(s) Educated Patient    Methods Explanation;Demonstration;Verbal cues    Comprehension Verbalized understanding;Returned demonstration;Verbal cues required              PT Short Term Goals - 06/26/21 1405       PT SHORT TERM GOAL #1   Title Pt will demonstrate independence with HEP to improve LBP for increased ability to participate with ADLs    Baseline HEP given    Time 4    Period Weeks    Status New    Target Date 07/24/21               PT Long Term Goals - 06/26/21 1350       PT LONG TERM GOAL #1   Title Patient will increase FOTO score to 63 to demonstrate predicted increase in functional mobility to complete ADLs    Baseline 06/26/21: 53    Time 8    Period Weeks    Status New    Target Date 08/21/21      PT LONG TERM GOAL #2   Title Pt will improve single leg stance to 20 sec or greater 0/10 pain in order to demonstrate increased LE strength, increased balance, and decrease likelihood of falling.    Baseline 06/26/21: L 16 sec  R 8 sec    Time 8    Period Weeks    Status New    Target Date 08/21/21      PT LONG TERM GOAL #3   Title Pt will increase 6MWT by at least 8m(166f without pain greater than 1/10 in order to demonstrate clinically significant improvement in LE strength, cardiopulmonary endurance, and community ambulation,    Baseline 06/26/21:107033f 3/10pain    Time 8    Period Weeks    Status New    Target Date 08/21/21                   Plan - 07/17/21 1332     Clinical Impression Statement PT continued therex progression for increased hip and core strength with carry over into functional mobility with success. Paitent is able to comply with all cuing for proper technique of therex, with excellent motivation throughout session. Pt with consistent cuing needed for eccentric, control of movements with decent carry over following cuing. Pt will continue to benefit from skilled PT to  address strength deficits  for increased ind function and pain management. PT will continue progression as able.    Personal Factors and Comorbidities Age;Comorbidity 3+;Time since onset of injury/illness/exacerbation    Examination-Activity Limitations Lift;Carry;Stand;Locomotion Level;Stairs    Examination-Participation Restrictions Cleaning;Community Activity    Stability/Clinical Decision Making Evolving/Moderate complexity    Clinical Decision Making Moderate    Rehab Potential Fair    PT Frequency 2x / week    PT Duration 8 weeks    PT Treatment/Interventions ADLs/Self Care Home Management;Cryotherapy;Moist Heat;Traction;Stair training;Gait training;Therapeutic exercise;Balance training;Neuromuscular re-education;Patient/family education;Therapeutic activities;Functional mobility training;Manual techniques;Passive range of motion;Dry needling;Energy conservation;Splinting;Taping;Spinal Manipulations    PT Next Visit Plan Review HEP, core strengthening, manual therapy    PT Home Exercise Plan glute brudge, pelvic tilts    Consulted and Agree with Plan of Care Patient             Patient will benefit from skilled therapeutic intervention in order to improve the following deficits and impairments:  Abnormal gait, Decreased balance, Decreased endurance, Decreased mobility, Difficulty walking, Decreased range of motion, Decreased activity tolerance, Decreased strength, Postural dysfunction, Pain  Visit Diagnosis: Chronic right-sided low back pain without sciatica  Muscle weakness (generalized)     Problem List There are no problems to display for this patient.  Durwin Reges DPT Durwin Reges 07/17/2021, 1:37 PM  [facility] [address], [place], [address] Phone: [phone]   Fax:  [phone]  Name: Pallie Banez MRN: [id number] Date of Birth: [birth year]

## 2021-07-18 HISTORY — PX: UPPER GI ENDOSCOPY: SHX6162

## 2021-07-19 ENCOUNTER — Ambulatory Visit
Admission: RE | Admit: 2021-07-19 | Discharge: 2021-07-19 | Disposition: A | Discharge status: Home / Self Care | Payer: Medicare Other | Source: Ambulatory Visit | Attending: Obstetrics and Gynecology | Admitting: Obstetrics and Gynecology

## 2021-07-19 ENCOUNTER — Other Ambulatory Visit: Payer: Self-pay

## 2021-07-19 ENCOUNTER — Ambulatory Visit: Payer: Medicare Other | Attending: Family Medicine | Admitting: Physical Therapy

## 2021-07-19 DIAGNOSIS — N6310 Unspecified lump in the right breast, unspecified quadrant: Secondary | ICD-10-CM

## 2021-07-19 DIAGNOSIS — M6281 Muscle weakness (generalized): Secondary | ICD-10-CM | POA: Insufficient documentation

## 2021-07-19 DIAGNOSIS — Z1231 Encounter for screening mammogram for malignant neoplasm of breast: Secondary | ICD-10-CM

## 2021-07-19 DIAGNOSIS — N6311 Unspecified lump in the right breast, upper outer quadrant: Secondary | ICD-10-CM

## 2021-07-19 DIAGNOSIS — M545 Low back pain, unspecified: Secondary | ICD-10-CM | POA: Insufficient documentation

## 2021-07-19 DIAGNOSIS — C50919 Malignant neoplasm of unspecified site of unspecified female breast: Secondary | ICD-10-CM

## 2021-07-19 DIAGNOSIS — G8929 Other chronic pain: Secondary | ICD-10-CM | POA: Insufficient documentation

## 2021-07-19 DIAGNOSIS — R2681 Unsteadiness on feet: Secondary | ICD-10-CM | POA: Insufficient documentation

## 2021-07-19 HISTORY — DX: Malignant neoplasm of unspecified site of unspecified female breast: C50.919

## 2021-07-20 ENCOUNTER — Other Ambulatory Visit: Payer: Self-pay | Admitting: Obstetrics and Gynecology

## 2021-07-20 ENCOUNTER — Telehealth: Payer: Self-pay

## 2021-07-20 DIAGNOSIS — R921 Mammographic calcification found on diagnostic imaging of breast: Secondary | ICD-10-CM

## 2021-07-20 DIAGNOSIS — N6310 Unspecified lump in the right breast, unspecified quadrant: Secondary | ICD-10-CM

## 2021-07-20 DIAGNOSIS — R928 Other abnormal and inconclusive findings on diagnostic imaging of breast: Secondary | ICD-10-CM

## 2021-07-20 NOTE — Telephone Encounter (Signed)
Patient requesting orders/referral for a biopsy that she needs. [phone]

## 2021-07-24 ENCOUNTER — Telehealth: Payer: Self-pay | Admitting: Obstetrics and Gynecology

## 2021-07-24 ENCOUNTER — Ambulatory Visit: Payer: Medicare Other | Admitting: Physical Therapy

## 2021-07-24 NOTE — Telephone Encounter (Signed)
Pt aware of cat 5 mammo results and has bx sched 07/26/21. She is leaving for [place] and would like gen surg appt prior to if possible.  Spoke with Dr. Bary Castilla, ok for pt to see him before bx results available. Has bx 07/26/21. Referral sent to his office, they will call to sched appt

## 2021-07-25 ENCOUNTER — Encounter: Payer: Self-pay | Admitting: Physical Therapy

## 2021-07-25 ENCOUNTER — Ambulatory Visit: Payer: Medicare Other | Admitting: Physical Therapy

## 2021-07-25 DIAGNOSIS — M6281 Muscle weakness (generalized): Secondary | ICD-10-CM | POA: Diagnosis not present

## 2021-07-25 DIAGNOSIS — G8929 Other chronic pain: Secondary | ICD-10-CM | POA: Diagnosis present

## 2021-07-25 DIAGNOSIS — M545 Low back pain, unspecified: Secondary | ICD-10-CM

## 2021-07-25 DIAGNOSIS — R2681 Unsteadiness on feet: Secondary | ICD-10-CM | POA: Diagnosis not present

## 2021-07-25 NOTE — Addendum Note (Signed)
Addended by: Ardeth Perfect B on: [id number] 11:18 AM   Modules accepted: Orders

## 2021-07-25 NOTE — Therapy (Signed)
[facility] [address], [place], [address] Phone: [phone]   Fax:  [phone]  Physical Therapy Treatment  Patient Details  Name: Linda Romero MRN: [id number] Date of Birth: [birth year] Referring Provider (PT): Meeler, Whitney FNP   Encounter Date: 07/25/2021   PT End of Session - 07/25/21 1030     Visit Number 5    Number of Visits 17    Date for PT Re-Evaluation 08/21/21    Authorization - Visit Number 5    Progress Note Due on Visit 10    PT Start Time 0945    PT Stop Time 1024    PT Time Calculation (min) 39 min    Activity Tolerance Patient tolerated treatment well    Behavior During Therapy [facility] for tasks assessed/performed             Past Medical History:  Diagnosis Date   Acute urinary retention    Aortic valve endocarditis    Bacteremia of undetermined etiology    Colon cancer screening    Contusion of left foot    Disorder of refraction    Duodenal ulcer due to Helicobacter pylori    GERD (gastroesophageal reflux disease)    Groin pain    H/O bacterial endocarditis    Hepatitis C    Hepatocellular carcinoma (HCC)    Hip pain, bilateral    Liver cirrhosis ([place])    MSSA bacteremia    Pain of right hip joint    Plantar fasciitis    Sepsis ([place])    Thrombocytopenia ([place])    Uterine prolapse     Past Surgical History:  Procedure Laterality Date   TUBAL LIGATION     UPPER GI ENDOSCOPY      There were no vitals filed for this visit.   Subjective Assessment - 07/25/21 0956     Subjective Pt report her night pain has improved since last therapy session. She rates her LBP pain as 2/10 on NPRS today. She reports that she has to receive a biopsy of her breast to determine if findings from recent imaging are malignant or benign.    Pertinent History Pt is a 70 y.o. female referred to PT for R sided LBP without sciatica. 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 Oncology MD reports R sided flank pain she believes could be cx related however it appears R sided LBP began on March 12 with MOI of falling into wall at home trying to get into her bed. Pain described as stiff and constant. Mild relief of PT at [facility]. She states that she has difficulty with prolonged standing, walking, and lifting. She denies having history of falls before or after incident.  Pt denies any unexplained weight fluctuation, saddle paresthesia, lose of bowel/bladder control, or unrelenting night pain at this time. She does have a PMH of liver cancer, HEP C, and arthritis.    Limitations Other (comment);Walking;Lifting    How long can you sit comfortably? 3 hours    How long can you stand comfortably? 2 hours    How long can you walk comfortably? 30 minutes    Diagnostic tests MRI    Patient Stated Goals walk and stand longer without pain  Therapeutic Exercise   Nu Step L3 x 5 min seat 8 for LE strengthening/endurance  Posterior pelvic tilts 3 x 10 reps with cueing for TA activation and to perform motion slowly  Glute bridges 2 x 10 reps with 2-3 sec hold, min cuing for core activation to prevent excessive lumbar ext   Supine alt marching 2x 10 with difficulty maintaining hip elevation, cuing for small foot lift with some carry over  Supine Dead bug without arm lift 2 x 6 reps with cueing to tighten core and correct LE form   Lower trunk rotation 1 x 10 reps   Squat to mat table (touch only) 1 x 10 with cuing to activate core holding #5 DB UE straight; 1 x 10 reps with physioball  Physioball rollouts 2 x 10 reps with cueing to sequencing for flexion, L<>R motion, and then lumbar extension with some carry  over                PT Education - 07/25/21 1000     Education Details therex form/technique    Person(s) Educated Patient    Methods Explanation;Demonstration    Comprehension Verbalized understanding;Returned demonstration              PT Short Term Goals - 06/26/21 1405       PT SHORT TERM GOAL #1   Title Pt will demonstrate independence with HEP to improve LBP for increased ability to participate with ADLs    Baseline HEP given    Time 4    Period Weeks    Status New    Target Date 07/24/21               PT Long Term Goals - 06/26/21 1350       PT LONG TERM GOAL #1   Title Patient will increase FOTO score to 63 to demonstrate predicted increase in functional mobility to complete ADLs    Baseline 06/26/21: 53    Time 8    Period Weeks    Status New    Target Date 08/21/21      PT LONG TERM GOAL #2   Title Pt will improve single leg stance to 20 sec or greater 0/10 pain in order to demonstrate increased LE strength, increased balance, and decrease likelihood of falling.    Baseline 06/26/21: L 16 sec  R 8 sec    Time 8    Period Weeks    Status New    Target Date 08/21/21      PT LONG TERM GOAL #3   Title Pt will increase 6MWT by at least 55m(1637f without pain greater than 1/10 in order to demonstrate clinically significant improvement in LE strength, cardiopulmonary endurance, and community ambulation,    Baseline 06/26/21:[id number] 3/10pain    Time 8    Period Weeks    Status New    Target Date 08/21/21                   Plan - 07/25/21 1254     Clinical Impression Statement Pt tolerated session well with reports of decreased pain from 2/10 to 1/10 following therex. PT session continued LE/core strengthening and mobility. PT utilized cueing to improve quality of form and eccentric control. Pt will continue to benefit from skilled PT in order to improve QOL and achieve set goals.    Personal Factors and Comorbidities  Age;Comorbidity 3+;Time since onset of injury/illness/exacerbation    Examination-Activity Limitations Lift;Carry;Stand;Locomotion Level;Stairs  Stability/Clinical Decision Making Evolving/Moderate complexity    Clinical Decision Making Moderate    Rehab Potential Fair    PT Frequency 2x / week    PT Duration 8 weeks    PT Treatment/Interventions ADLs/Self Care Home Management;Cryotherapy;Moist Heat;Traction;Stair training;Gait training;Therapeutic exercise;Balance training;Neuromuscular re-education;Patient/family education;Therapeutic activities;Functional mobility training;Manual techniques;Passive range of motion;Dry needling;Energy conservation;Splinting;Taping;Spinal Manipulations    PT Next Visit Plan Review HEP, core strengthening, manual therapy    PT Home Exercise Plan glute brudge, pelvic tilts    Consulted and Agree with Plan of Care Patient             Patient will benefit from skilled therapeutic intervention in order to improve the following deficits and impairments:  Abnormal gait, Decreased balance, Decreased endurance, Decreased mobility, Difficulty walking, Decreased range of motion, Decreased activity tolerance, Decreased strength, Postural dysfunction, Pain  Visit Diagnosis: Chronic right-sided low back pain without sciatica     Problem List There are no problems to display for this patient.    Durwin Reges DPT Sharion Settler, SPT  Durwin Reges, PT 07/25/2021, 1:53 PM  [facility] [address], [place], [address] Phone: [phone]   Fax:  [phone]  Name: Linda Romero MRN: [id number] Date of Birth: August 13, 1951

## 2021-07-26 ENCOUNTER — Ambulatory Visit
Admission: RE | Admit: 2021-07-26 | Discharge: 2021-07-26 | Disposition: A | Discharge status: Home / Self Care | Payer: Medicare Other | Source: Ambulatory Visit | Attending: Obstetrics and Gynecology | Admitting: Obstetrics and Gynecology

## 2021-07-26 ENCOUNTER — Other Ambulatory Visit: Payer: Self-pay

## 2021-07-26 ENCOUNTER — Ambulatory Visit: Payer: Medicare Other | Admitting: Physical Therapy

## 2021-07-26 DIAGNOSIS — R928 Other abnormal and inconclusive findings on diagnostic imaging of breast: Secondary | ICD-10-CM | POA: Insufficient documentation

## 2021-07-26 DIAGNOSIS — N6310 Unspecified lump in the right breast, unspecified quadrant: Secondary | ICD-10-CM

## 2021-07-26 HISTORY — PX: BREAST BIOPSY: SHX20

## 2021-07-27 NOTE — Progress Notes (Signed)
Navigation initiated.  Notified Dr. Dwyane Luo office that patient is aware they will be calling her 07/30/21 to schedule appointment.  Patient states she is seeing Oncologist at [facility], and will continue under Dr. Tarry Kos care for right now.

## 2021-07-30 ENCOUNTER — Other Ambulatory Visit: Payer: Self-pay

## 2021-07-30 ENCOUNTER — Ambulatory Visit
Admission: RE | Admit: 2021-07-30 | Discharge: 2021-07-30 | Disposition: A | Discharge status: Home / Self Care | Payer: Medicare Other | Source: Ambulatory Visit | Attending: Obstetrics and Gynecology | Admitting: Obstetrics and Gynecology

## 2021-07-30 DIAGNOSIS — R921 Mammographic calcification found on diagnostic imaging of breast: Secondary | ICD-10-CM | POA: Diagnosis present

## 2021-07-30 DIAGNOSIS — R928 Other abnormal and inconclusive findings on diagnostic imaging of breast: Secondary | ICD-10-CM | POA: Diagnosis present

## 2021-07-30 HISTORY — PX: BREAST BIOPSY: SHX20

## 2021-07-31 ENCOUNTER — Ambulatory Visit: Payer: Medicare Other | Admitting: Physical Therapy

## 2021-07-31 ENCOUNTER — Other Ambulatory Visit: Payer: Self-pay | Admitting: General Surgery

## 2021-07-31 DIAGNOSIS — D0512 Intraductal carcinoma in situ of left breast: Secondary | ICD-10-CM

## 2021-08-01 ENCOUNTER — Encounter: Payer: Self-pay | Admitting: Obstetrics and Gynecology

## 2021-08-01 LAB — SURGICAL PATHOLOGY

## 2021-08-02 ENCOUNTER — Encounter: Payer: Medicare Other | Admitting: Physical Therapy

## 2021-08-02 ENCOUNTER — Other Ambulatory Visit: Payer: Self-pay | Admitting: General Surgery

## 2021-08-02 DIAGNOSIS — D0512 Intraductal carcinoma in situ of left breast: Secondary | ICD-10-CM

## 2021-08-02 DIAGNOSIS — C50411 Malignant neoplasm of upper-outer quadrant of right female breast: Secondary | ICD-10-CM

## 2021-08-02 NOTE — Progress Notes (Signed)
Subjective:     Patient ID: Linda Romero is a 70 y.o. female.   HPI   The following portions of the patient's history were reviewed and updated as appropriate.   This a new patient is here today for: office visit. Patient has been referred by Ardeth Perfect, PA for evaluation of a newly diagnosed right breast cancer. The patient had two right breast biopsies done on 07-26-21.    Patient also had a left breast stereotactic biopsy on 07-30-21. Results are still pending.    She felt a lump about 2 weeks ago, she does not do routine self breast exams.    The patient reports her bra size is 36 C.   The patient reports that she had had a very painful mammogram in the distant past and afterwards did not return for additional studies.   The last several years have revolved around her hepatitis C.  She reports moving to [place], [place] for about 6-8 years to be near the hepatologist involved with one of the antiviral treatment regimens.  She eventually moved to [place] to be near family and is now being followed at [facility].  Earlier this year she was diagnosed with hepatocellular carcinoma and has undergone treatment most recently with immunotherapy at [facility].   She is here with her husband, Jame Seelig.   Her daughter, Danielle Rankin was on the phone during the visit.       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)     Hepatic encephalopathy (CMS-HCC)     Hepatitis      Hep C   Hepatocellular carcinoma (CMS-HCC)     History of bacterial endocarditis     Hypertension      Liver cancer (CMS-HCC)     Plantar fasciitis     Portal vein thrombosis     Radicular leg pain     Sepsis (CMS-HCC)     Thrombocytopenia (CMS-HCC)     Uterine prolapse             Past Surgical History:  Procedure Laterality Date   BIOPSY BREAST W/ LOC DEVICE PLACEMENT AND STEREOTACTIC GUIDANCE Left 07/30/2021   BREAST EXCISIONAL BIOPSY Right 07/26/2021   ESOPHAGOGASTRODOUDENOSCOPY W/BIOPSY N/A 07/18/2021    Procedure: ESOPHAGOGASTRODUODENOSCOPY, FLEXIBLE, TRANSORAL; WITH BIOPSY, SINGLE OR MULTIPLE;  Surgeon: Maylon Cos, MD;  Location: [facility];  Service: Gastroenterology;  Laterality: N/A;   TUBAL LIGATION       upper endoscopy                    OB History     Gravida  2   Para  2   Term      Preterm      AB      Living  2      SAB      IAB      Ectopic  Molar      Multiple      Live Births  2        Obstetric Comments  Age at first period 23 Age of first pregnancy 31             Social History           Socioeconomic History   Marital status: Married   Number of children: 2  Tobacco Use   Smoking status: Never Smoker   Smokeless tobacco: Never Used  Vaping Use   Vaping Use: Never used  Substance and Sexual Activity   Alcohol use: No   Drug use: Never   Sexual activity: Yes      Birth control/protection: Post-menopausal  Social History Narrative         September, 2019 [facility]    Marital status: Married. Lives in [place] from [place] to [facility] July, 2017.    Originally from [place]     Children: x 2 daughter    Work: Retired    Tobacco: none    ETOH: None    Illegal drugs: None    Exercise: None    Diet/eating habit: fairly healthy.             No Known Allergies   Current Medications        Current Outpatient Medications  Medication Sig Dispense Refill   cephalexin (KEFLEX) 500 MG capsule Take 500 mg by mouth once daily       FUROsemide (LASIX) 20 MG tablet Take 2 tablets (40 mg  total) by mouth once daily 180 tablet 1   lactulose (ENULOSE) 20 gram/30 mL solution Take 30 mLs by mouth once daily 1 to 2 times daily to ensure 2-3 bowel movements per day (Patient taking differently: Take 120 mLs by mouth once daily)   0   spironolactone (ALDACTONE) 50 MG tablet Take 1 tablet (50 mg total) by mouth once daily 90 tablet 1   traMADoL (ULTRAM) 50 mg tablet Take 50 mg by mouth every 6 (six) hours as needed for Pain       lactulose (ENULOSE) 10 gram/15 mL oral solution 3 (three) times daily (Patient not taking: Reported on 07/31/2021)        No current facility-administered medications for this visit.             Family History  Problem Relation Age of Onset   High blood pressure (Hypertension) Mother     Cancer Mother          mouth   Diabetes Father     Diabetes Brother     Diabetes Paternal Grandmother     Colon cancer Neg Hx     Breast cancer Neg Hx              Review of Systems  Constitutional: Negative for chills and fever.  Respiratory: Negative for cough.          Objective:   Physical Exam Exam conducted with a chaperone present.  Constitutional:      Appearance: Normal appearance.  Cardiovascular:     Rate and Rhythm: Normal rate and regular rhythm.     Pulses: Normal pulses.     Heart sounds: Normal heart sounds.  Pulmonary:     Effort: Pulmonary effort is normal.     Breath sounds: Normal breath sounds.  Chest:  Breasts:     Right: Mass present.     Left: Normal.  Musculoskeletal:     Cervical back: Neck supple.  Lymphadenopathy:     Upper Body:     Right upper body: No supraclavicular or axillary adenopathy.     Left upper body: No supraclavicular or axillary adenopathy.  Skin:    General: Skin is warm and dry.  Neurological:     Mental Status: She is alert and oriented to person, place, and time.  Psychiatric:        Mood and Affect: Mood normal.        Behavior: Behavior normal.      Labs and Radiology:    Chest CT  January 08, 2021 reviewed: Asymmetric density in the upper outer quadrant of the right breast without vascular enhancement.   Mammograms and ultrasounds of July 19, 2021 through July 30, 2021 were independently reviewed.     Right upper outer quadrant biopsy of July 26, 2021:   A. RIGHT BREAST, 10:30, 4 CMFN; ULTRASOUND-GUIDED BIOPSY:  - INVASIVE MAMMARY CARCINOMA, NO SPECIAL TYPE.   Size of invasive carcinoma: 6 mm in this sample  Histologic grade of invasive carcinoma: Grade 2                       Glandular/tubular differentiation score: 3                       Nuclear pleomorphism score: 2                       Mitotic rate score: 1                       Total score: 6  Ductal carcinoma in situ: Present  Lymphovascular invasion: Not identified   Comment:  The definitive grade will be assigned on the excisional specimen.  ER/PR/HER2 immunohistochemistry will be performed on block B1, with  reflex to [place] for HER2 2+. The results will be reported in an addendum.    B. RIGHT BREAST, 10:30, 7 CMFN; ULTRASOUND-GUIDED BIOPSY:  - INVASIVE MAMMARY CARCINOMA, AS DESCRIBED ABOVE (PART A), 9 MM IN THIS  SAMPLE.  - DUCTAL CARCINOMA IN SITU.    Left breast upper outer quadrant biopsy of July 30, 2021 (results available after the patient had left the office):   DIAGNOSIS:  A. BREAST, LEFT UPPER OUTER QUADRANT; STEREOTACTIC CORE NEEDLE BIOPSY:  - DUCTAL CARCINOMA IN SITU, INTERMEDIATE GRADE, WITH MICROPAPILLARY  FEATURES AND ASSOCIATED CALCIFICATIONS.  - NEGATIVE FOR INVASIVE MAMMARY CARCINOMA. Review of the postbiopsy mammogram on the left shows little discernible distortion, and the patient will likely require wire localization for this lesion.     Laboratory review:   Component Ref Range & Units 3 wk ago  (07/06/21) 1 mo ago  (06/15/21) 1 mo ago  (06/06/21) 3 mo ago  (04/09/21) 5 mo ago  (02/19/21) 1 yr ago  (04/11/20) 1 yr ago  (03/13/20)     Sodium 135 - 145  mmol/L 133 Low   138  134 Low   134 Low   138 R  138 R  135    Potassium 3.5 - 5.0 mmol/L 3.4 Low   3.4 Low   3.4 Low   3.7 CM  4.0 R  3.6 R  3.6    Chloride 98 - 108 mmol/L 103  107  101  103  104 R  107 R  102    Carbon Dioxide (CO2) 21 - 30  mmol/L 23  25  24  22   27.4 R  24.8 R  23    Urea Nitrogen (BUN) 7 - 20 mg/dL 16  14  13  11  13  R  13 R  9    Creatinine 0.4 - 1.0 mg/dL 0.8  0.7  0.8  0.8  0.8 R  0.7 R  0.9    Glucose 70 - 140 mg/dL 178 High   106 CM  108 CM  131 CM  76 R  117 High  R  102 CM   Comment: Interpretive Data:  Above is the NONFASTING reference range.   Below are the FASTING reference ranges:  NORMAL:      70-99 mg/dL  PREDIABETES: 100-125 mg/dL  DIABETES:    > 125 mg/dL    Calcium 8.7 - 10.2 mg/dL 9.1  8.9  9.4  9.5  9.5 R  8.7 R  9.4    AST (Aspartate Aminotransferase) 15 - 41 U/L 94 High   76 High   79 High   71 High   59 High  R  49 High  R  48 High     ALT (Alanine Aminotransferase) 14 - 54 U/L 36  32  42  29  27 R  20 R  8 Low     Bilirubin, Total 0.4 - 1.5 mg/dL 3.1 High   3.1 High   3.8 High   3.5 High   2.6 High  R  1.7 High  R  2.2 High     Alk Phos (Alkaline Phosphatase) 24 - 110 U/L 236 High   201 High   232 High   205 High   242 High  R  201 High  R  201 High     Albumin 3.5 - 4.8 g/dL 2.7 Low   2.6 Low   3.0 Low   2.8 Low   3.4 Low   3.0 Low   2.7 Low     Protein, Total 6.2 - 8.1 g/dL 7.2  7.0  8.1  7.5  6.9 R  7.2 R  8.7 High     Anion Gap 3 - 12 mmol/L 7  6  9  9      10     BUN/CREA Ratio 6 - 27 20  20  16  14      10     Glomerular Filtration Rate (eGFR)  mL/min/1.73sq m 79  92 CM  79 CM  79 CM  71 R  83 R  65 CM         Component Ref Range & Units 4 d ago  (07/27/21) 1 mo ago  (06/15/21) 1 mo ago  (06/06/21) 3 mo ago  (04/09/21) 3 mo ago  (04/05/21) 1 yr ago  (04/11/20) 1 yr ago  (03/13/20)   WBC (White Blood Cell Count) 3.2 - 9.8 x10^9/L 3.9  3.8  5.7  6.8  5.0  4.5 R  5.1    Hemoglobin 12.0 - 15.5 g/dL 14.0  14.7  15.6 High   14.7  14.6  12.6 R  14.0     Hematocrit 35.0 - 45.0 % 40.3  44.1  46.3 High   44.2  44.0  38.3 R  42.7    Platelets 150 - 450 x10^9/L 114 Low   113 Low   146 Low   134 Low   126 Low     174    MCV (Mean Corpuscular Volume) 80 - 98 fL  97  98  96  93  93  95.0 R  98    MCH (Mean Corpuscular Hemoglobin) 26.5 - 34.0 pg 33.6  32.5  32.4  30.8  30.9  31.3 High  R  32.3    MCHC (Mean Corpuscular Hemoglobin Concentration) 31.4 - 36.0 % 34.7  33.3  33.7  33.3  33.2  32.9 R  32.8    RBC (Red Blood Cell Count) 3.77 - 5.16 x10^12/L 4.17  4.52  4.82  4.77  4.73  4.03 Low  R  4.34    RDW-CV (Red Cell Distribution Width) 11.5 - 14.5 % 14.8 High   17.0 High   16.7 High   16.7 High   16.7 High   13.6 R  14.2    NRBC (Nucleated Red Blood Cell Count) 0 x10^9/L 0.00  0.00  0.00  0.00  0.00    0.00    NRBC % (Nucleated Red Blood Cell %) % 0.0  0.0  0.0  0.0  0.0    0.0    MPV (Mean Platelet Volume) 7.2 - 11.7 fL 12.4 High   12.0 High   11.2  12.4 High   12.8 High   11.0 R  11.9 High     Neutrophil Count 2.0 - 8.6 x10^9/L 2.4  2.1  3.7  5.1    2.52 R      Neutrophil % 37 - 80 % 61.7  54.0  65.8  75.3    56.3 R      Lymphocyte Count 0.6 - 4.2 x10^9/L 0.6  0.7  0.9  0.6    1.01 R      Lymphocyte % 10 - 50 % 14.8  18.5  15.3  8.8 Low     22.5 R      Monocyte Count 0 - 0.9 x10^9/L 0.7  0.8  0.9  1.0 High     0.72 R      Monocyte % 0 - 12 % 18.1 High   19.6 High   15.3 High   14.5 High     16.1 High  R       Component Ref Range & Units 1 mo ago  (06/06/21) 5 mo ago  (02/19/21) 1 yr ago  (01/26/20) 2 yr ago  (11/20/18) 2 yr ago  (11/19/18) 2 yr ago  (11/18/18) 2 yr ago  (11/17/18)     Prothrombin Time 9.5 - 13.1 sec 12.3  12.6 R  13.4 High   21.4 High   23.0 High   22.9 High   28.4 High     Prothrombin INR 0.9 - 1.1 1.1  1.1 Low  R  1.1 CM  1.8 High  CM  2.0 High  CM  1.9 High  CM  2.4 High  CM              Assessment:     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   Hepatocellular carcinoma undergoing treatment with immunotherapy.  Long-term prognosis unclear.    Plan:     At the time of the patient's visit, the results of yesterday's left breast biopsy were not available and all of the discussion was directed to her known multifocal right breast disease.  Without knowing all the details of her [facility], her life expectancy is based on that rather than her recently identified breast cancer.   She would be  a candidate for breast conservation (mastectomy declined) with sentinel node biopsy.  Pros and cons of surgical intervention were discussed.   With the identification of intermediate grade DCIS on the left, she would also be a candidate for antiestrogen blockade alone if this would be administered for the right side cancer (pending receptor status) and approval by her medical oncologist in light of her known hepatocellular carcinoma.  More likely, the patient would undergo resection of this area at the time of her primary invasive right breast cancer.   A message has been sent to her medical Tamera Reason, MD regarding timing of surgery in relation to her ongoing immunotherapy.   There is an open OR date on September 26 with her scheduled for immunotherapy on September 30.  We will hold this date pending follow-up with medical oncology.   Over 1 hour was spent on today's visit majority of which was spent in consultation and review of prior records.   Patient to be scheduled for surgery at a convenient date after her upcoming trip to [place].     This note is partially prepared by Ledell Noss, CMA acting as a scribe in the presence of Dr. Hervey Ard, MD.    The documentation recorded by the scribe accurately reflects the service I personally performed and the decisions made by me.    Robert Bellow, MD FACS   I have received a message from Dr. Oralia Rud that her immunotherapy should not impair her white count or  platelet response.  We will tentatively proceed on August 13, 2021 with right breast wide excision and sentinel node biopsy with left breast excision.  Patient will be started on KCL, 10 mEq daily prior to surgery. (Low dose secondary to spironolactone therapy.

## 2021-08-06 LAB — SURGICAL PATHOLOGY

## 2021-08-07 ENCOUNTER — Encounter
Admission: RE | Admit: 2021-08-07 | Discharge: 2021-08-07 | Disposition: A | Discharge status: Home / Self Care | Payer: Medicare Other | Source: Ambulatory Visit | Attending: General Surgery | Admitting: General Surgery

## 2021-08-07 ENCOUNTER — Other Ambulatory Visit: Payer: Self-pay

## 2021-08-07 HISTORY — DX: Unspecified osteoarthritis, unspecified site: M19.90

## 2021-08-07 NOTE — Patient Instructions (Addendum)
Your procedure is scheduled on: Monday, September 26 Please go to the [facility] at 7:45 am  REMEMBER: Instructions that are not followed completely may result in serious medical risk, up to and including death; or upon the discretion of your surgeon and anesthesiologist your surgery may need to be rescheduled.  Do not eat or drink after midnight the night before surgery.  No gum chewing, lozengers or hard candies.  TAKE THESE MEDICATIONS THE MORNING OF SURGERY WITH A SIP OF WATER:  Tramadol if needed for pain  One week prior to surgery: starting September 19 Stop Anti-inflammatories (NSAIDS) such as Advil, Aleve, Ibuprofen, Motrin, Naproxen, Naprosyn and Aspirin based products such as Excedrin, Goodys Powder, BC Powder. Stop ANY OVER THE COUNTER supplements until after surgery. You may however, continue to take Tylenol if needed for pain up until the day of surgery.  No Alcohol for 24 hours before or after surgery.  No Smoking including e-cigarettes for 24 hours prior to surgery.  No chewable tobacco products for at least 6 hours prior to surgery.  No nicotine patches on the day of surgery.  Do not use any "recreational" drugs for at least a week prior to your surgery.  Please be advised that the combination of cocaine and anesthesia may have negative outcomes, up to and including death. If you test positive for cocaine, your surgery will be cancelled.  On the morning of surgery brush your teeth with toothpaste and water, you may rinse your mouth with mouthwash if you wish. Do not swallow any toothpaste or mouthwash.  Use CHG Soap as directed on instruction sheet.  Do not wear jewelry, make-up, hairpins, clips or nail polish.  Do not wear lotions, powders, or perfumes.   Do not shave body from the neck down 48 hours prior to surgery just in case you cut yourself which could leave a site for infection.  Also, freshly shaved skin may become irritated if using the CHG  soap.  Contact lenses, hearing aids and dentures may not be worn into surgery.  Do not bring valuables to the hospital. [facility] is not responsible for any missing/lost belongings or valuables.   Notify your doctor if there is any change in your medical condition (cold, fever, infection).  Wear comfortable clothing (specific to your surgery type) to the hospital.  After surgery, you can help prevent lung complications by doing breathing exercises.  Take deep breaths and cough every 1-2 hours. Your doctor may order a device called an Incentive Spirometer to help you take deep breaths.  If you are being discharged the day of surgery, you will not be allowed to drive home. You will need a responsible adult (18 years or older) to drive you home and stay with you that night.   If you are taking public transportation, you will need to have a responsible adult (18 years or older) with you. Please confirm with your physician that it is acceptable to use public transportation.   Please call the [place] [facility] at [phone] if you have any questions about these instructions.  Surgery Visitation Policy:  Patients undergoing a surgery or procedure may have one family member or support person with them as long as that person is not COVID-19 positive or experiencing its symptoms.  That person may remain in the waiting area during the procedure and may rotate out with other people.

## 2021-08-07 NOTE — Pre-Procedure Instructions (Signed)
Copy and pasted from Dr. Bary Castilla note on 08/02/2021:   I have received a message from Dr. Oralia Rud that her immunotherapy should not impair her white count or platelet response.  We will tentatively proceed on August 13, 2021 with right breast wide excision and sentinel node biopsy with left breast excision.   Patient will be started on KCL, 10 mEq daily prior to surgery. (Low dose secondary to spironolactone therapy.           Electronically signed by Robert Bellow, MD at 08/02/2021  1:31 PM

## 2021-08-13 ENCOUNTER — Ambulatory Visit: Admission: RE | Admit: 2021-08-13 | Payer: Medicare Other | Source: Ambulatory Visit

## 2021-08-13 ENCOUNTER — Ambulatory Visit
Admission: RE | Admit: 2021-08-13 | Discharge: 2021-08-13 | Disposition: A | Discharge status: Home / Self Care | Payer: Medicare Other | Source: Ambulatory Visit | Attending: General Surgery | Admitting: General Surgery

## 2021-08-13 ENCOUNTER — Other Ambulatory Visit: Payer: Self-pay

## 2021-08-13 ENCOUNTER — Ambulatory Visit
Admission: RE | Admit: 2021-08-13 | Discharge: 2021-08-13 | Disposition: A | Discharge status: Home / Self Care | Payer: Medicare Other | Attending: General Surgery | Admitting: General Surgery

## 2021-08-13 ENCOUNTER — Encounter
Admission: RE | Disposition: A | Discharge status: Home / Self Care | Payer: Self-pay | Source: Home / Self Care | Attending: General Surgery

## 2021-08-13 ENCOUNTER — Ambulatory Visit: Payer: Medicare Other | Admitting: Certified Registered Nurse Anesthetist

## 2021-08-13 ENCOUNTER — Encounter: Payer: Self-pay | Admitting: General Surgery

## 2021-08-13 ENCOUNTER — Other Ambulatory Visit: Payer: Self-pay | Admitting: General Surgery

## 2021-08-13 DIAGNOSIS — C50411 Malignant neoplasm of upper-outer quadrant of right female breast: Secondary | ICD-10-CM

## 2021-08-13 DIAGNOSIS — D0511 Intraductal carcinoma in situ of right breast: Secondary | ICD-10-CM | POA: Diagnosis not present

## 2021-08-13 DIAGNOSIS — C773 Secondary and unspecified malignant neoplasm of axilla and upper limb lymph nodes: Secondary | ICD-10-CM | POA: Insufficient documentation

## 2021-08-13 DIAGNOSIS — Z170 Estrogen receptor positive status [ER+]: Secondary | ICD-10-CM | POA: Diagnosis not present

## 2021-08-13 DIAGNOSIS — I10 Essential (primary) hypertension: Secondary | ICD-10-CM | POA: Insufficient documentation

## 2021-08-13 DIAGNOSIS — B182 Chronic viral hepatitis C: Secondary | ICD-10-CM | POA: Diagnosis not present

## 2021-08-13 DIAGNOSIS — D0512 Intraductal carcinoma in situ of left breast: Secondary | ICD-10-CM | POA: Insufficient documentation

## 2021-08-13 DIAGNOSIS — Z79899 Other long term (current) drug therapy: Secondary | ICD-10-CM | POA: Diagnosis not present

## 2021-08-13 DIAGNOSIS — Z8505 Personal history of malignant neoplasm of liver: Secondary | ICD-10-CM | POA: Insufficient documentation

## 2021-08-13 HISTORY — PX: BREAST LUMPECTOMY WITH NEEDLE LOCALIZATION: SHX5759

## 2021-08-13 HISTORY — PX: BREAST LUMPECTOMY WITH SENTINEL LYMPH NODE BIOPSY: SHX5597

## 2021-08-13 LAB — POCT I-STAT, CHEM 8
BUN: 12 mg/dL (ref 8–23)
Calcium, Ion: 1.12 mmol/L — ABNORMAL LOW (ref 1.15–1.40)
Chloride: 103 mmol/L (ref 98–111)
Creatinine, Ser: 0.7 mg/dL (ref 0.44–1.00)
Glucose, Bld: 101 mg/dL — ABNORMAL HIGH (ref 70–99)
HCT: 41 % (ref 36.0–46.0)
Hemoglobin: 13.9 g/dL (ref 12.0–15.0)
Potassium: 3.2 mmol/L — ABNORMAL LOW (ref 3.5–5.1)
Sodium: 140 mmol/L (ref 135–145)
TCO2: 25 mmol/L (ref 22–32)

## 2021-08-13 SURGERY — BREAST LUMPECTOMY WITH SENTINEL LYMPH NODE BX
Anesthesia: General | Laterality: Right

## 2021-08-13 MED ORDER — GLYCOPYRROLATE 0.2 MG/ML IJ SOLN
INTRAMUSCULAR | Status: AC
  Filled 2021-08-13: qty 1

## 2021-08-13 MED ORDER — SODIUM CHLORIDE 0.9 % IV SOLN
INTRAVENOUS | Status: DC | PRN
  Administered 2021-08-13: 20 ug/min via INTRAVENOUS

## 2021-08-13 MED ORDER — ONDANSETRON HCL 4 MG/2ML IJ SOLN
INTRAMUSCULAR | Status: DC | PRN
  Administered 2021-08-13: 4 mg via INTRAVENOUS

## 2021-08-13 MED ORDER — ONDANSETRON HCL 4 MG/2ML IJ SOLN
INTRAMUSCULAR | Status: AC
  Filled 2021-08-13: qty 2

## 2021-08-13 MED ORDER — BUPIVACAINE-EPINEPHRINE (PF) 0.5% -1:200000 IJ SOLN
INTRAMUSCULAR | Status: DC | PRN
  Administered 2021-08-13: 30 mL via PERINEURAL

## 2021-08-13 MED ORDER — PROPOFOL 10 MG/ML IV BOLUS
INTRAVENOUS | Status: DC | PRN
  Administered 2021-08-13: 30 mg via INTRAVENOUS
  Administered 2021-08-13: 100 mg via INTRAVENOUS

## 2021-08-13 MED ORDER — KETOROLAC TROMETHAMINE 30 MG/ML IJ SOLN
INTRAMUSCULAR | Status: DC | PRN
  Administered 2021-08-13: 15 mg via INTRAVENOUS

## 2021-08-13 MED ORDER — ACETAMINOPHEN 10 MG/ML IV SOLN
INTRAVENOUS | Status: DC | PRN
  Administered 2021-08-13: 1000 mg via INTRAVENOUS

## 2021-08-13 MED ORDER — DEXAMETHASONE SODIUM PHOSPHATE 10 MG/ML IJ SOLN
INTRAMUSCULAR | Status: DC | PRN
  Administered 2021-08-13: 10 mg via INTRAVENOUS

## 2021-08-13 MED ORDER — CHLORHEXIDINE GLUCONATE CLOTH 2 % EX PADS: 6.0000 | MEDICATED_PAD | Freq: Once | CUTANEOUS | Status: DC

## 2021-08-13 MED ORDER — FENTANYL CITRATE (PF) 100 MCG/2ML IJ SOLN
INTRAMUSCULAR | Status: DC | PRN
  Administered 2021-08-13 (×4): 50 ug via INTRAVENOUS

## 2021-08-13 MED ORDER — METHYLENE BLUE 0.5 % INJ SOLN
INTRAVENOUS | Status: DC | PRN
  Administered 2021-08-13: 5 mL via SUBMUCOSAL

## 2021-08-13 MED ORDER — DEXMEDETOMIDINE (PRECEDEX) IN NS 20 MCG/5ML (4 MCG/ML) IV SYRINGE
PREFILLED_SYRINGE | INTRAVENOUS | Status: DC | PRN
  Administered 2021-08-13: 8 ug via INTRAVENOUS
  Administered 2021-08-13: 4 ug via INTRAVENOUS
  Administered 2021-08-13: 8 ug via INTRAVENOUS

## 2021-08-13 MED ORDER — FAMOTIDINE 20 MG PO TABS
ORAL_TABLET | ORAL | Status: AC
  Filled 2021-08-13: qty 1

## 2021-08-13 MED ORDER — EPHEDRINE SULFATE 50 MG/ML IJ SOLN
INTRAMUSCULAR | Status: DC | PRN
  Administered 2021-08-13: 5 mg via INTRAVENOUS

## 2021-08-13 MED ORDER — BUPIVACAINE-EPINEPHRINE (PF) 0.5% -1:200000 IJ SOLN
INTRAMUSCULAR | Status: AC
  Filled 2021-08-13: qty 30

## 2021-08-13 MED ORDER — ORAL CARE MOUTH RINSE: 15.0000 mL | Freq: Once | OROMUCOSAL | Status: AC

## 2021-08-13 MED ORDER — PROPOFOL 10 MG/ML IV BOLUS
INTRAVENOUS | Status: AC
  Filled 2021-08-13: qty 20

## 2021-08-13 MED ORDER — TECHNETIUM TC 99M TILMANOCEPT KIT
1.1060 | PACK | Freq: Once | INTRAVENOUS | Status: AC | PRN
  Administered 2021-08-13: 1.106 via INTRADERMAL

## 2021-08-13 MED ORDER — FAMOTIDINE 20 MG PO TABS
20.0000 mg | ORAL_TABLET | Freq: Once | ORAL | Status: AC
  Administered 2021-08-13: 20 mg via ORAL

## 2021-08-13 MED ORDER — LACTATED RINGERS IV SOLN: INTRAVENOUS | Status: DC

## 2021-08-13 MED ORDER — FENTANYL CITRATE (PF) 100 MCG/2ML IJ SOLN
INTRAMUSCULAR | Status: AC
  Filled 2021-08-13: qty 2

## 2021-08-13 MED ORDER — METHYLENE BLUE 0.5 % INJ SOLN
INTRAVENOUS | Status: AC
  Filled 2021-08-13: qty 10

## 2021-08-13 MED ORDER — PHENYLEPHRINE HCL (PRESSORS) 10 MG/ML IV SOLN
INTRAVENOUS | Status: DC | PRN
  Administered 2021-08-13 (×2): 100 ug via INTRAVENOUS
  Administered 2021-08-13 (×2): 50 ug via INTRAVENOUS
  Administered 2021-08-13: 100 ug via INTRAVENOUS

## 2021-08-13 MED ORDER — OXYCODONE HCL 5 MG/5ML PO SOLN
5.0000 mg | Freq: Once | ORAL | Status: DC | PRN
Start: 2021-08-13 — End: 2021-08-13

## 2021-08-13 MED ORDER — DEXMEDETOMIDINE (PRECEDEX) IN NS 20 MCG/5ML (4 MCG/ML) IV SYRINGE
PREFILLED_SYRINGE | INTRAVENOUS | Status: AC
  Filled 2021-08-13: qty 5

## 2021-08-13 MED ORDER — FENTANYL CITRATE (PF) 100 MCG/2ML IJ SOLN: 25.0000 ug | INTRAMUSCULAR | Status: DC | PRN

## 2021-08-13 MED ORDER — CEFAZOLIN SODIUM-DEXTROSE 2-4 GM/100ML-% IV SOLN
2.0000 g | INTRAVENOUS | Status: AC
  Administered 2021-08-13: 2 g via INTRAVENOUS

## 2021-08-13 MED ORDER — LIDOCAINE HCL (PF) 2 % IJ SOLN
INTRAMUSCULAR | Status: AC
  Filled 2021-08-13: qty 5

## 2021-08-13 MED ORDER — CHLORHEXIDINE GLUCONATE 0.12 % MT SOLN
15.0000 mL | Freq: Once | OROMUCOSAL | Status: AC
  Administered 2021-08-13: 15 mL via OROMUCOSAL

## 2021-08-13 MED ORDER — OXYCODONE HCL 5 MG PO TABS
5.0000 mg | ORAL_TABLET | Freq: Once | ORAL | Status: DC | PRN
Start: 2021-08-13 — End: 2021-08-13

## 2021-08-13 MED ORDER — MEPERIDINE HCL 25 MG/ML IJ SOLN: 6.2500 mg | INTRAMUSCULAR | Status: DC | PRN

## 2021-08-13 MED ORDER — CEFAZOLIN SODIUM-DEXTROSE 2-4 GM/100ML-% IV SOLN
INTRAVENOUS | Status: AC
  Filled 2021-08-13: qty 100

## 2021-08-13 MED ORDER — GLYCOPYRROLATE 0.2 MG/ML IJ SOLN
INTRAMUSCULAR | Status: DC | PRN
  Administered 2021-08-13: .2 mg via INTRAVENOUS

## 2021-08-13 MED ORDER — PROMETHAZINE HCL 25 MG/ML IJ SOLN: 6.2500 mg | INTRAMUSCULAR | Status: DC | PRN

## 2021-08-13 MED ORDER — LIDOCAINE HCL (CARDIAC) PF 100 MG/5ML IV SOSY
PREFILLED_SYRINGE | INTRAVENOUS | Status: DC | PRN
  Administered 2021-08-13: 80 mg via INTRAVENOUS

## 2021-08-13 MED ORDER — ACETAMINOPHEN 10 MG/ML IV SOLN
INTRAVENOUS | Status: AC
  Filled 2021-08-13: qty 100

## 2021-08-13 MED ORDER — HYDROCODONE-ACETAMINOPHEN 5-325 MG PO TABS: 1.0000 | ORAL_TABLET | ORAL | 0 refills | Status: AC | PRN

## 2021-08-13 MED ORDER — CHLORHEXIDINE GLUCONATE 0.12 % MT SOLN
OROMUCOSAL | Status: AC
  Filled 2021-08-13: qty 15

## 2021-08-13 MED ORDER — DEXAMETHASONE SODIUM PHOSPHATE 10 MG/ML IJ SOLN
INTRAMUSCULAR | Status: AC
  Filled 2021-08-13: qty 1

## 2021-08-13 SURGICAL SUPPLY — 63 items
APL PRP STRL LF DISP 70% ISPRP (MISCELLANEOUS) ×2
BINDER BREAST LRG (GAUZE/BANDAGES/DRESSINGS) ×1 IMPLANT
BLADE BOVIE TIP EXT 4 (BLADE) IMPLANT
BLADE PHOTON ILLUMINATED (MISCELLANEOUS) ×1 IMPLANT
BLADE SURG 15 STRL SS SAFETY (BLADE) ×4 IMPLANT
BULB RESERV EVAC DRAIN JP 100C (MISCELLANEOUS) IMPLANT
CHLORAPREP W/TINT 26 (MISCELLANEOUS) ×4 IMPLANT
CNTNR SPEC 2.5X3XGRAD LEK (MISCELLANEOUS)
CONT SPEC 4OZ STER OR WHT (MISCELLANEOUS)
CONT SPEC 4OZ STRL OR WHT (MISCELLANEOUS)
CONTAINER SPEC 2.5X3XGRAD LEK (MISCELLANEOUS) IMPLANT
COVER PROBE FLX POLY STRL (MISCELLANEOUS) ×2 IMPLANT
DEVICE DUBIN SPECIMEN MAMMOGRA (MISCELLANEOUS) ×2 IMPLANT
DRAIN CHANNEL JP 15F RND 16 (MISCELLANEOUS) IMPLANT
DRAPE LAPAROTOMY 100X77 ABD (DRAPES) ×2 IMPLANT
DRAPE LAPAROTOMY TRNSV 106X77 (MISCELLANEOUS) ×2 IMPLANT
DRSG GAUZE FLUFF 36X18 (GAUZE/BANDAGES/DRESSINGS) ×4 IMPLANT
DRSG TELFA 3X8 NADH (GAUZE/BANDAGES/DRESSINGS) ×2 IMPLANT
DRSG TELFA 4X3 1S NADH ST (GAUZE/BANDAGES/DRESSINGS) ×4 IMPLANT
ELECT CAUTERY BLADE TIP 2.5 (TIP) ×2
ELECT REM PT RETURN 9FT ADLT (ELECTROSURGICAL) ×2
ELECTRODE CAUTERY BLDE TIP 2.5 (TIP) ×1 IMPLANT
ELECTRODE REM PT RTRN 9FT ADLT (ELECTROSURGICAL) ×1 IMPLANT
GAUZE 4X4 16PLY ~~LOC~~+RFID DBL (SPONGE) ×2 IMPLANT
GLOVE SURG ENC MOIS LTX SZ7.5 (GLOVE) ×2 IMPLANT
GLOVE SURG UNDER LTX SZ8 (GLOVE) ×2 IMPLANT
GOWN STRL REUS W/ TWL LRG LVL3 (GOWN DISPOSABLE) ×2 IMPLANT
GOWN STRL REUS W/TWL LRG LVL3 (GOWN DISPOSABLE) ×4
KIT MARKER MARGIN INK (KITS) ×1 IMPLANT
KIT TURNOVER KIT A (KITS) ×2 IMPLANT
LABEL OR SOLS (LABEL) ×2 IMPLANT
MANIFOLD NEPTUNE II (INSTRUMENTS) ×2 IMPLANT
MARGIN MAP 10MM (MISCELLANEOUS) ×3 IMPLANT
NDL HYPO 25X1 1.5 SAFETY (NEEDLE) ×2 IMPLANT
NDL SPNL 20GX3.5 QUINCKE YW (NEEDLE) IMPLANT
NEEDLE HYPO 22GX1.5 SAFETY (NEEDLE) ×2 IMPLANT
NEEDLE HYPO 25X1 1.5 SAFETY (NEEDLE) ×4 IMPLANT
NEEDLE SPNL 20GX3.5 QUINCKE YW (NEEDLE) IMPLANT
PACK BASIN MINOR ARMC (MISCELLANEOUS) ×2 IMPLANT
PAD DRESSING TELFA 3X8 NADH (GAUZE/BANDAGES/DRESSINGS) ×1 IMPLANT
RETRACTOR RING XSMALL (MISCELLANEOUS) IMPLANT
RTRCTR WOUND ALEXIS 13CM XS SH (MISCELLANEOUS)
SHEARS FOC LG CVD HARMONIC 17C (MISCELLANEOUS) IMPLANT
SHEARS HARMONIC 9CM CVD (BLADE) IMPLANT
SLEVE PROBE SENORX GAMMA FIND (MISCELLANEOUS) IMPLANT
STRIP CLOSURE SKIN 1/2X4 (GAUZE/BANDAGES/DRESSINGS) ×2 IMPLANT
SUT ETHILON 3-0 FS-10 30 BLK (SUTURE) ×2
SUT SILK 2 0 (SUTURE) ×2
SUT SILK 2-0 18XBRD TIE 12 (SUTURE) ×1 IMPLANT
SUT VIC AB 2-0 CT1 27 (SUTURE) ×4
SUT VIC AB 2-0 CT1 TAPERPNT 27 (SUTURE) ×2 IMPLANT
SUT VIC AB 3-0 SH 27 (SUTURE) ×4
SUT VIC AB 3-0 SH 27X BRD (SUTURE) ×2 IMPLANT
SUT VIC AB 4-0 FS2 27 (SUTURE) ×4 IMPLANT
SUT VICRYL+ 3-0 144IN (SUTURE) ×2 IMPLANT
SUTURE EHLN 3-0 FS-10 30 BLK (SUTURE) ×1 IMPLANT
SWABSTK COMLB BENZOIN TINCTURE (MISCELLANEOUS) ×2 IMPLANT
SYR 10ML LL (SYRINGE) ×2 IMPLANT
SYR BULB IRRIG 60ML STRL (SYRINGE) ×2 IMPLANT
TAPE TRANSPORE STRL 2 31045 (GAUZE/BANDAGES/DRESSINGS) ×2 IMPLANT
TRAP NEPTUNE SPECIMEN COLLECT (MISCELLANEOUS) ×2 IMPLANT
WATER STERILE IRR 1000ML POUR (IV SOLUTION) ×2 IMPLANT
WATER STERILE IRR 500ML POUR (IV SOLUTION) ×2 IMPLANT

## 2021-08-13 NOTE — Anesthesia Preprocedure Evaluation (Signed)
Anesthesia Evaluation  Patient identified by MRN, date of birth, ID band Patient awake    Reviewed: Allergy & Precautions, NPO status , Patient's Chart, lab work & pertinent test results  History of Anesthesia Complications Negative for: history of anesthetic complications  Airway Mallampati: I  TM Distance: >3 FB Neck ROM: Full    Dental  (+) Poor Dentition, Missing   Pulmonary neg pulmonary ROS, neg sleep apnea, neg COPD,    breath sounds clear to auscultation- rhonchi (-) wheezing      Cardiovascular hypertension, Pt. on medications (-) CAD, (-) Past MI, (-) Cardiac Stents and (-) CABG  Rhythm:Regular Rate:Normal - Systolic murmurs and - Diastolic murmurs    Neuro/Psych neg Seizures negative neurological ROS  negative psych ROS   GI/Hepatic PUD, GERD  ,(+) Cirrhosis       , Hepatitis -, C  Endo/Other  negative endocrine ROSneg diabetes  Renal/GU negative Renal ROS     Musculoskeletal  (+) Arthritis ,   Abdominal (+) + obese,   Peds  Hematology   Anesthesia Other Findings Past Medical History: No date: Acute urinary retention No date: Aortic valve endocarditis No date: Arthritis No date: Bacteremia of undetermined etiology 07/2021: Breast cancer ([place]) No date: Colon cancer screening No date: Contusion of left foot No date: Disorder of refraction No date: Duodenal ulcer due to Helicobacter pylori No date: GERD (gastroesophageal reflux disease) No date: Groin pain No date: H/O bacterial endocarditis No date: Hepatic encephalopathy (HCC) No date: Hepatitis C No date: Hepatocellular carcinoma ([place]) No date: Hip pain, bilateral No date: Hypertension No date: Liver cirrhosis ([place]) No date: MSSA bacteremia No date: Pain of right hip joint No date: Plantar fasciitis No date: Portal vein thrombosis No date: Sepsis ([place]) No date: Thrombocytopenia (HCC) No date: Uterine prolapse    Reproductive/Obstetrics                             Anesthesia Physical Anesthesia Plan  ASA: 3  Anesthesia Plan: General   Post-op Pain Management:    Induction: Intravenous  PONV Risk Score and Plan: 2 and Ondansetron and Dexamethasone  Airway Management Planned: LMA  Additional Equipment:   Intra-op Plan:   Post-operative Plan:   Informed Consent: I have reviewed the patients History and Physical, chart, labs and discussed the procedure including the risks, benefits and alternatives for the proposed anesthesia with the patient or authorized representative who has indicated his/her understanding and acceptance.     Dental advisory given  Plan Discussed with: CRNA and Anesthesiologist  Anesthesia Plan Comments:         Anesthesia Quick Evaluation

## 2021-08-13 NOTE — Op Note (Signed)
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  Postoperative diagnosis: Same.  Operative procedure wide local excision of right upper outer quadrant multifocal cancer with tissue transfer, sentinel node biopsy.  Operating Surgeon: Hervey Ard, MD.  Anesthesia: General by LMA, Marcaine 0.5% with 1: 200,000 units of epinephrine, 30 cc local infiltration and pectoralis block.  Estimated blood loss: Less than 30 cc.  Clinical note: This 70 year old woman recently identified with a mass in the right breast and subsequent biopsy of 2 foci of concern on mammography showed invasive mammary carcinoma.  The left breast biopsy for calcifications showed an area of DCIS, intermediate grade.  In light of her known history of slowly progressive hepatocellular carcinoma and the ER positive status of the DCIS it was elected to treat her invasive cancer and plan on treating the noninvasive cancer with antiestrogen therapy.  The patient underwent injection with technetium sulfur colloid prior to the procedure.  She did receive Ancef intravenously prior to the procedure due to her altered immune status.  SCD stockings for DVT prevention.  Operative note: Patient underwent general anesthesia and tolerated this well.  The breast was cleansed with alcohol followed by 5 cc of 0.5% methylene blue.  The breast was then cleansed with ChloraPrep and draped.  Ultrasound was used to confirm the location of both tumors (image obtained of both sites for permanent record).  The more medial lesion was very superficial.  The lateral lesion more in the midportion of the breast parenchyma.  A block of tissue 5 x 7 x 13 cm was subsequently excised, inked and orientated and specimen radiograph confirmed both biopsy clips within the specimen.  Gross examination suggested the anterior margin of the medial lesion was close but -2 mm.  While the breast was being processed attention was turned to the  axilla as it would affect planned postoperative radiation.  1 hot blue node and 1 hot 9 blue nodes were identified and removed and sent in formalin for routine histology.  The breast was then elevated off the underlying pectoralis muscle circumferentially for about 5 cm.  This laterally included the serratus fascia.  This was then approximated with interrupted 2-0 Vicryl figure-of-eight sutures.  A second layer including the breast parenchyma was then completed and then the skin approximated with a running 4-0 Vicryl subcuticular suture.  Minimal tethering on the skin was noted.  Benzoin and Steri-Strips followed by Telfa, fluff gauze and a compressive wrap were applied.  The patient tolerated the procedure well and was taken to the recovery room in stable condition.

## 2021-08-13 NOTE — Discharge Instructions (Addendum)
AMBULATORY SURGERY  DISCHARGE INSTRUCTIONS   The drugs that you were given will stay in your system until tomorrow so for the next 24 hours you should not:  Drive an automobile Make any legal decisions Drink any alcoholic beverage   You may resume regular meals tomorrow.  Today it is better to start with liquids and gradually work up to solid foods.  You may eat anything you prefer, but it is better to start with liquids, then soup and crackers, and gradually work up to solid foods.   Please notify your doctor immediately if you have any unusual bleeding, trouble breathing, redness and pain at the surgery site, drainage, fever, or pain not relieved by medication.    Additional Instructions:        Please contact your physician with any problems or Same Day Surgery at [phone], Monday through Friday 6 am to 4 pm, or [place] at [facility] number at [phone]. AMBULATORY SURGERY  DISCHARGE INSTRUCTIONS   The drugs that you were given will stay in your system until tomorrow so for the next 24 hours you should not:  Drive an automobile Make any legal decisions Drink any alcoholic beverage   You may resume regular meals tomorrow.  Today it is better to start with liquids and gradually work up to solid foods.  You may eat anything you prefer, but it is better to start with liquids, then soup and crackers, and gradually work up to solid foods.   Please notify your doctor immediately if you have any unusual bleeding, trouble breathing, redness and pain at the surgery site, drainage, fever, or pain not relieved by medication.    Your post-operative visit with Dr.                                       is: Date:                        Time:    Please call to schedule your post-operative visit.  Additional Instructions:

## 2021-08-13 NOTE — Transfer of Care (Signed)
Immediate Anesthesia Transfer of Care Note  Patient: Linda Romero  Procedure(s) Performed: BREAST LUMPECTOMY WITH SENTINEL LYMPH NODE BX (Right) BREAST LUMPECTOMY WITH NEEDLE LOCALIZATION (Right)  Patient Location: PACU  Anesthesia Type:General  Level of Consciousness: drowsy  Airway & Oxygen Therapy: Patient Spontanous Breathing and Patient connected to face mask oxygen  Post-op Assessment: Report given to RN and Post -op Vital signs reviewed and stable  Post vital signs: Reviewed and stable  Last Vitals:  Vitals Value Taken Time  BP 107/62 08/13/21 1233  Temp    Pulse 84 08/13/21 1235  Resp 16 08/13/21 1235  SpO2 95 % 08/13/21 1235  Vitals shown include unvalidated device data.  Last Pain:  Vitals:   08/13/21 0927  TempSrc: Tympanic  PainSc: 0-No pain      Patients Stated Pain Goal: 0 (12/81/18 8677)  Complications: No notable events documented.

## 2021-08-13 NOTE — Anesthesia Procedure Notes (Signed)
Procedure Name: LMA Insertion Date/Time: 08/13/2021 11:56 AM Performed by: Tollie Eth, CRNA Pre-anesthesia Checklist: Patient identified, Patient being monitored, Timeout performed, Emergency Drugs available and Suction available Patient Re-evaluated:Patient Re-evaluated prior to induction Oxygen Delivery Method: Circle system utilized Preoxygenation: Pre-oxygenation with 100% oxygen Induction Type: IV induction Ventilation: Mask ventilation without difficulty LMA: LMA inserted LMA Size: 3.5 Tube type: Oral Number of attempts: 1 Placement Confirmation: positive ETCO2 and breath sounds checked- equal and bilateral Tube secured with: Tape Dental Injury: Teeth and Oropharynx as per pre-operative assessment

## 2021-08-13 NOTE — H&P (Addendum)
HPI   The following portions of the patient's history were reviewed and updated as appropriate.   This a new patient is here today for: office visit. Patient has been referred by Ardeth Perfect, PA for evaluation of a newly diagnosed right breast cancer. The patient had two right breast biopsies done on 07-26-21.    Patient also had a left breast stereotactic biopsy on 07-30-21. Results are still pending.    She felt a lump about 2 weeks ago, she does not do routine self breast exams.    The patient reports her bra size is 36 C.   The patient reports that she had had a very painful mammogram in the distant past and afterwards did not return for additional studies.   The last several years have revolved around her hepatitis C.  She reports moving to [place], [place] for about 6-8 years to be near the hepatologist involved with one of the antiviral treatment regimens.  She eventually moved to [place] to be near family and is now being followed at [facility].  Earlier this year she was diagnosed with hepatocellular carcinoma and has undergone treatment most recently with immunotherapy at [facility].   She is here with her husband, Alixis Harmon.   Her daughter, Danielle Rankin was on the phone during the visit.         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)     Hepatic encephalopathy (CMS-HCC)     Hepatitis      Hep C   Hepatocellular carcinoma (CMS-HCC)     History of bacterial endocarditis     Hypertension     Liver cancer (CMS-HCC)     Plantar fasciitis     Portal  vein thrombosis     Radicular leg pain     Sepsis (CMS-HCC)     Thrombocytopenia (CMS-HCC)     Uterine prolapse                 Past Surgical History:  Procedure Laterality Date   BIOPSY BREAST W/ LOC DEVICE PLACEMENT AND STEREOTACTIC GUIDANCE Left 07/30/2021   BREAST EXCISIONAL BIOPSY Right 07/26/2021   ESOPHAGOGASTRODOUDENOSCOPY W/BIOPSY N/A 07/18/2021    Procedure: ESOPHAGOGASTRODUODENOSCOPY, FLEXIBLE, TRANSORAL; WITH BIOPSY, SINGLE OR MULTIPLE;  Surgeon: Maylon Cos, MD;  Location: [facility];  Service: Gastroenterology;  Laterality: N/A;   TUBAL LIGATION       upper endoscopy                           OB History     Gravida  2   Para  2   Term      Preterm      AB      Living  2      SAB      IAB      Ectopic  Molar      Multiple      Live Births  2        Obstetric Comments  Age at first period 23 Age of first pregnancy 5             Social History               Socioeconomic History   Marital status: Married   Number of children: 2  Tobacco Use   Smoking status: Never Smoker   Smokeless tobacco: Never Used  Vaping Use   Vaping Use: Never used  Substance and Sexual Activity   Alcohol use: No   Drug use: Never   Sexual activity: Yes      Birth control/protection: Post-menopausal  Social History Narrative         September, 2019 [facility]    Marital status: Married. Lives in [place] from [place] to [facility] July, 2017.    Originally from [place]     Children: x 2 daughter    Work: Retired    Tobacco: none    ETOH: None    Illegal drugs: None    Exercise: None    Diet/eating habit: fairly healthy.             No Known Allergies   Current Medications             Current Outpatient Medications  Medication Sig Dispense Refill   cephalexin (KEFLEX) 500 MG capsule Take 500 mg by mouth once daily       FUROsemide (LASIX) 20 MG tablet Take 2 tablets (40 mg total) by mouth once daily 180 tablet 1    lactulose (ENULOSE) 20 gram/30 mL solution Take 30 mLs by mouth once daily 1 to 2 times daily to ensure 2-3 bowel movements per day (Patient taking differently: Take 120 mLs by mouth once daily)   0   spironolactone (ALDACTONE) 50 MG tablet Take 1 tablet (50 mg total) by mouth once daily 90 tablet 1   traMADoL (ULTRAM) 50 mg tablet Take 50 mg by mouth every 6 (six) hours as needed for Pain       lactulose (ENULOSE) 10 gram/15 mL oral solution 3 (three) times daily (Patient not taking: Reported on 07/31/2021)        No current facility-administered medications for this visit.                 Family History  Problem Relation Age of Onset   High blood pressure (Hypertension) Mother     Cancer Mother          mouth   Diabetes Father     Diabetes Brother     Diabetes Paternal Grandmother     Colon cancer Neg Hx     Breast cancer Neg Hx              Review of Systems  Constitutional: Negative for chills and fever.  Respiratory: Negative for cough.          Objective:   Physical Exam Exam conducted with a chaperone present.  Constitutional:      Appearance: Normal appearance.  Cardiovascular:     Rate and Rhythm: Normal rate and regular rhythm.     Pulses: Normal pulses.     Heart sounds: Normal heart sounds.  Pulmonary:     Effort: Pulmonary effort is normal.     Breath sounds: Normal breath sounds.  Chest:  Breasts:  Right: Mass present.     Left: Normal.      Musculoskeletal:     Cervical back: Neck supple.  Lymphadenopathy:     Upper Body:     Right upper body: No supraclavicular or axillary adenopathy.     Left upper body: No supraclavicular or axillary adenopathy.  Skin:    General: Skin is warm and dry.  Neurological:     Mental Status: She is alert and oriented to person, place, and time.  Psychiatric:        Mood and Affect: Mood normal.        Behavior: Behavior normal.      Labs and Radiology:    Chest CT January 08, 2021 reviewed: Asymmetric  density in the upper outer quadrant of the right breast without vascular enhancement.   Mammograms and ultrasounds of July 19, 2021 through July 30, 2021 were independently reviewed.     Right upper outer quadrant biopsy of July 26, 2021:   A. RIGHT BREAST, 10:30, 4 CMFN; ULTRASOUND-GUIDED BIOPSY:  - INVASIVE MAMMARY CARCINOMA, NO SPECIAL TYPE.   Size of invasive carcinoma: 6 mm in this sample  Histologic grade of invasive carcinoma: Grade 2                       Glandular/tubular differentiation score: 3                       Nuclear pleomorphism score: 2                       Mitotic rate score: 1                       Total score: 6  Ductal carcinoma in situ: Present  Lymphovascular invasion: Not identified   Comment:  The definitive grade will be assigned on the excisional specimen.  ER/PR/HER2 immunohistochemistry will be performed on block B1, with  reflex to [place] for HER2 2+. The results will be reported in an addendum.    B. RIGHT BREAST, 10:30, 7 CMFN; ULTRASOUND-GUIDED BIOPSY:  - INVASIVE MAMMARY CARCINOMA, AS DESCRIBED ABOVE (PART A), 9 MM IN THIS  SAMPLE.  - DUCTAL CARCINOMA IN SITU.    Left breast upper outer quadrant biopsy of July 30, 2021 (results available after the patient had left the office):   DIAGNOSIS:  A. BREAST, LEFT UPPER OUTER QUADRANT; STEREOTACTIC CORE NEEDLE BIOPSY:  - DUCTAL CARCINOMA IN SITU, INTERMEDIATE GRADE, WITH MICROPAPILLARY  FEATURES AND ASSOCIATED CALCIFICATIONS.  - NEGATIVE FOR INVASIVE MAMMARY CARCINOMA. Review of the postbiopsy mammogram on the left shows little discernible distortion, and the patient will likely require wire localization for this lesion.     Laboratory review:   Component Ref Range & Units 3 wk ago  (07/06/21) 1 mo ago  (06/15/21) 1 mo ago  (06/06/21) 3 mo ago  (04/09/21) 5 mo ago  (02/19/21) 1 yr ago  (04/11/20) 1 yr ago  (03/13/20)      Sodium 135 - 145 mmol/L 133 Low   138  134 Low   134 Low    138 R  138 R  135     Potassium 3.5 - 5.0 mmol/L 3.4 Low   3.4 Low   3.4 Low   3.7 CM  4.0 R  3.6 R  3.6     Chloride 98 - 108 mmol/L 103  107  101  103  104 R  107 R  102     Carbon Dioxide (CO2) 21 - 30 mmol/L 23  25  24  22   27.4 R  24.8 R  23     Urea Nitrogen (BUN) 7 - 20 mg/dL 16  14  13  11  13  R  13 R  9     Creatinine 0.4 - 1.0 mg/dL 0.8  0.7  0.8  0.8  0.8 R  0.7 R  0.9     Glucose 70 - 140 mg/dL 178 High   106 CM  108 CM  131 CM  76 R  117 High  R  102 CM   Comment: Interpretive Data:  Above is the NONFASTING reference range.   Below are the FASTING reference ranges:  NORMAL:      70-99 mg/dL  PREDIABETES: 100-125 mg/dL  DIABETES:    > 125 mg/dL     Calcium 8.7 - 10.2 mg/dL 9.1  8.9  9.4  9.5  9.5 R  8.7 R  9.4     AST (Aspartate Aminotransferase) 15 - 41 U/L 94 High   76 High   79 High   71 High   59 High  R  49 High  R  48 High      ALT (Alanine Aminotransferase) 14 - 54 U/L 36  32  42  29  27 R  20 R  8 Low      Bilirubin, Total 0.4 - 1.5 mg/dL 3.1 High   3.1 High   3.8 High   3.5 High   2.6 High  R  1.7 High  R  2.2 High      Alk Phos (Alkaline Phosphatase) 24 - 110 U/L 236 High   201 High   232 High   205 High   242 High  R  201 High  R  201 High      Albumin 3.5 - 4.8 g/dL 2.7 Low   2.6 Low   3.0 Low   2.8 Low   3.4 Low   3.0 Low   2.7 Low      Protein, Total 6.2 - 8.1 g/dL 7.2  7.0  8.1  7.5  6.9 R  7.2 R  8.7 High      Anion Gap 3 - 12 mmol/L 7  6  9  9      10      BUN/CREA Ratio 6 - 27 20  20  16  14      10      Glomerular Filtration Rate (eGFR)  mL/min/1.73sq m 79  92 CM  79 CM  79 CM  71 R  83 R  65 CM         Component Ref Range & Units 4 d ago  (07/27/21) 1 mo ago  (06/15/21) 1 mo ago  (06/06/21) 3 mo ago  (04/09/21) 3 mo ago  (04/05/21) 1 yr ago  (04/11/20) 1 yr ago  (03/13/20)    WBC (White Blood Cell Count) 3.2 - 9.8 x10^9/L 3.9  3.8  5.7  6.8  5.0  4.5 R  5.1     Hemoglobin 12.0 - 15.5 g/dL 14.0  14.7  15.6 High   14.7  14.6  12.6 R  14.0     Hematocrit 35.0 -  45.0 % 40.3  44.1  46.3 High   44.2  44.0  38.3 R  42.7     Platelets 150 - 450 x10^9/L  114 Low   113 Low   146 Low   134 Low   126 Low     174     MCV (Mean Corpuscular Volume) 80 - 98 fL 97  98  96  93  93  95.0 R  98     MCH (Mean Corpuscular Hemoglobin) 26.5 - 34.0 pg 33.6  32.5  32.4  30.8  30.9  31.3 High  R  32.3     MCHC (Mean Corpuscular Hemoglobin Concentration) 31.4 - 36.0 % 34.7  33.3  33.7  33.3  33.2  32.9 R  32.8     RBC (Red Blood Cell Count) 3.77 - 5.16 x10^12/L 4.17  4.52  4.82  4.77  4.73  4.03 Low  R  4.34     RDW-CV (Red Cell Distribution Width) 11.5 - 14.5 % 14.8 High   17.0 High   16.7 High   16.7 High   16.7 High   13.6 R  14.2     NRBC (Nucleated Red Blood Cell Count) 0 x10^9/L 0.00  0.00  0.00  0.00  0.00    0.00     NRBC % (Nucleated Red Blood Cell %) % 0.0  0.0  0.0  0.0  0.0    0.0     MPV (Mean Platelet Volume) 7.2 - 11.7 fL 12.4 High   12.0 High   11.2  12.4 High   12.8 High   11.0 R  11.9 High      Neutrophil Count 2.0 - 8.6 x10^9/L 2.4  2.1  3.7  5.1    2.52 R       Neutrophil % 37 - 80 % 61.7  54.0  65.8  75.3    56.3 R       Lymphocyte Count 0.6 - 4.2 x10^9/L 0.6  0.7  0.9  0.6    1.01 R       Lymphocyte % 10 - 50 % 14.8  18.5  15.3  8.8 Low     22.5 R       Monocyte Count 0 - 0.9 x10^9/L 0.7  0.8  0.9  1.0 High     0.72 R       Monocyte % 0 - 12 % 18.1 High   19.6 High   15.3 High   14.5 High     16.1 High  R       Component Ref Range & Units 1 mo ago  (06/06/21) 5 mo ago  (02/19/21) 1 yr ago  (01/26/20) 2 yr ago  (11/20/18) 2 yr ago  (11/19/18) 2 yr ago  (11/18/18) 2 yr ago  (11/17/18)      Prothrombin Time 9.5 - 13.1 sec 12.3  12.6 R  13.4 High   21.4 High   23.0 High   22.9 High   28.4 High      Prothrombin INR 0.9 - 1.1 1.1  1.1 Low  R  1.1 CM  1.8 High  CM  2.0 High  CM  1.9 High  CM  2.4 High  CM              Assessment:     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   Hepatocellular carcinoma undergoing treatment with immunotherapy.  Long-term prognosis unclear.    Plan:     At the time of the patient's visit, the results  of yesterday's left breast biopsy were not available and all of the discussion was directed to her known multifocal right breast disease.  Without knowing all the details of her [facility], her life expectancy is based on that rather than her recently identified breast cancer.   She would be a candidate for breast conservation (mastectomy declined) with sentinel node biopsy.  Pros and cons of surgical intervention were discussed.   With the identification of intermediate grade DCIS on the left, she would also be a candidate for antiestrogen blockade alone if this would be administered for the right side cancer (pending receptor status) and approval by her medical oncologist in light of her known hepatocellular carcinoma.  More likely, the patient would undergo resection of this area at the time of her primary invasive right breast cancer.   A message has been sent to her medical Tamera Reason, MD regarding timing of surgery in relation to her ongoing immunotherapy.   There is an open OR date on September 26 with her scheduled for immunotherapy on September 30.  We will hold this date pending follow-up with medical oncology.   Over 1 hour was spent on today's visit majority of which was spent in consultation and review of prior records.   Patient to be scheduled for surgery at a convenient date after her upcoming trip to [place].     This note is partially prepared by Ledell Noss, CMA acting as a scribe in the presence of Dr. Hervey Ard, MD.    The documentation recorded by the scribe accurately reflects the service I personally performed and the decisions made by me.    Robert Bellow, MD FACS   I have received a message from Dr. Oralia Rud that her immunotherapy should not impair her white count or platelet  response.  We will tentatively proceed on August 13, 2021 with right breast wide excision and sentinel node biopsy with left breast excision.   Patient will be started on KCL, 10 mEq daily prior to surgery. (Low dose secondary to spironolactone therapy.    August 12, 2025: The patient had [place] and she was contacted today by phone with results regarding her hormone receptor studies for both breasts.  The left side, with intermediate grade DCIS is ER positive.  She would be a candidate for participation in the, trial if not for the contralateral cancer in the liver cancer.  Certainly, in light of her slowly progressive hepatic cancer, it is reasonable to consider treatment of the left breast DCIS with antiestrogen therapy alone.  The patient was informed of this option, and the fact that this is still not "standard care" but seems reasonable in light of her contralateral invasive cancer which is also ER positive and a candidate for estrogen blockade as well as her liver cancer.   The radiology department was contacted and they were asked to cancel the wire localization on the left side, but to maintain plans for the right side sentinel node biopsy.   Wire localization was scheduled for 8 AM.  The patient was advised that now that the procedure has been canceled, sentinel node injection is scheduled for 9 AM and she was asked to present to the hospital at 8:45 AM tomorrow.  She is aware to make use of Emla cream 1 hour prior.    August 13, 2021: No change in cardiopulmonary exam.  Rash on back of neck and mid back.  Faint, violaceous color. No pruritis.  Unclear etiology.  No contraindication to proceeding.

## 2021-08-13 NOTE — Anesthesia Postprocedure Evaluation (Signed)
Anesthesia Post Note  Patient: Jeania Nater  Procedure(s) Performed: BREAST LUMPECTOMY WITH SENTINEL LYMPH NODE BX (Right) BREAST LUMPECTOMY WITH NEEDLE LOCALIZATION (Right)  Patient location during evaluation: PACU Anesthesia Type: General Level of consciousness: awake and alert and oriented Pain management: pain level controlled Vital Signs Assessment: post-procedure vital signs reviewed and stable Respiratory status: spontaneous breathing, nonlabored ventilation and respiratory function stable Cardiovascular status: blood pressure returned to baseline and stable Postop Assessment: no signs of nausea or vomiting Anesthetic complications: no   No notable events documented.   Last Vitals:  Vitals:   08/13/21 1315 08/13/21 1327  BP: 123/82 130/69  Pulse: 83 73  Resp: 14 16  Temp: 36.9 C (!) 36.2 C  SpO2: 95% 94%    Last Pain:  Vitals:   08/13/21 1327  TempSrc: Temporal  PainSc: 0-No pain                 June Vacha

## 2021-08-14 ENCOUNTER — Encounter: Payer: Self-pay | Admitting: General Surgery

## 2021-08-16 ENCOUNTER — Other Ambulatory Visit: Payer: Self-pay | Admitting: Pathology

## 2021-08-16 LAB — SURGICAL PATHOLOGY

## 2021-08-22 ENCOUNTER — Other Ambulatory Visit: Payer: Self-pay | Admitting: General Surgery

## 2021-08-27 ENCOUNTER — Encounter: Payer: Self-pay | Admitting: General Surgery

## 2021-11-18 DEATH — deceased

## 2022-11-14 IMAGING — CT CT RENAL STONE PROTOCOL
2 of 3 series · 16 of 46 positions shown, 18 images · non-contrast
Comparison: 01/22/2020, MRI from 12/27/2020

CLINICAL DATA: History of cirrhosis and liver tumor with recent [AGE] therapy

EXAM:
CT ABDOMEN AND PELVIS WITHOUT CONTRAST
TECHNIQUE: Multidetector CT imaging of the abdomen and pelvis was performed
following the standard protocol without IV contrast.

[Series 4: lung bases · axial · 0.64mm/px · z∈[-295,-215]mm · 13 of 48 slices shown, 15 images]
[im 4/48  soft-tissue]
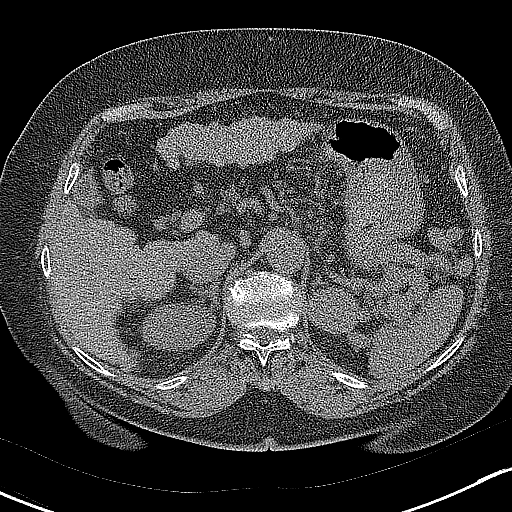
[im 4/48  bone]
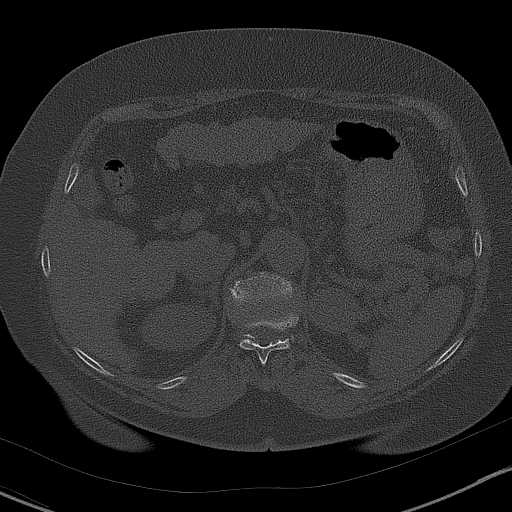
[im 7/48  soft-tissue]
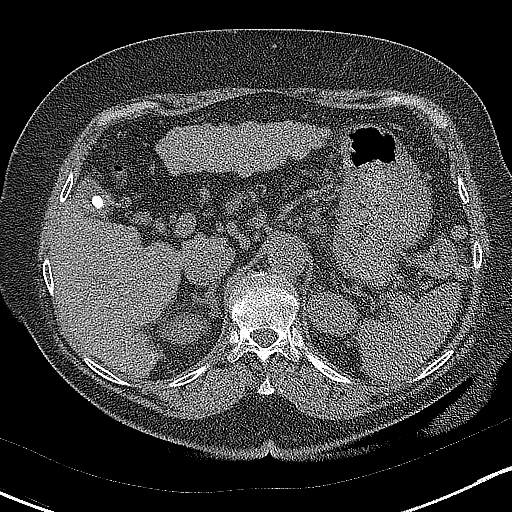
[im 10/48  soft-tissue]
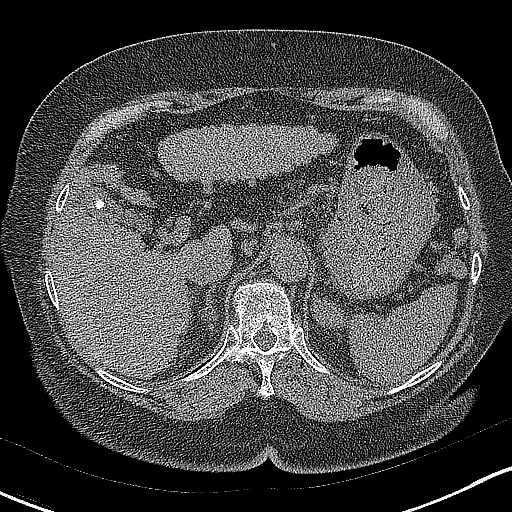
[im 14/48  soft-tissue]
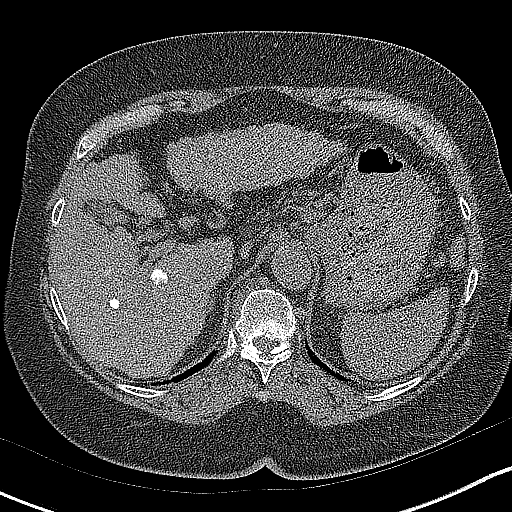
[im 17/48  soft-tissue]
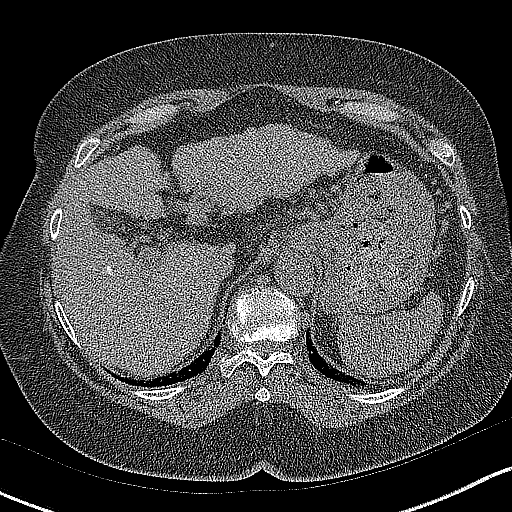
[im 20/48  soft-tissue]
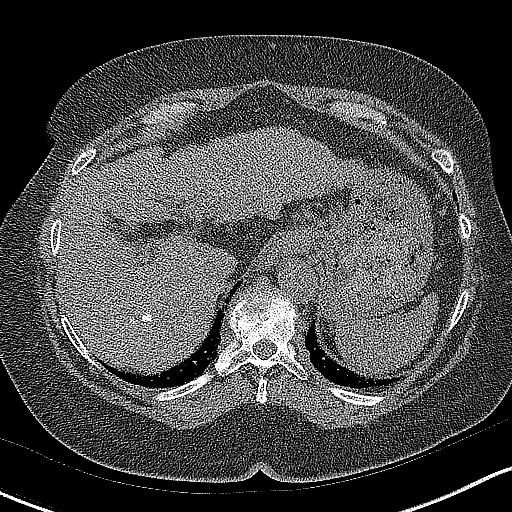
[im 25/48  soft-tissue]
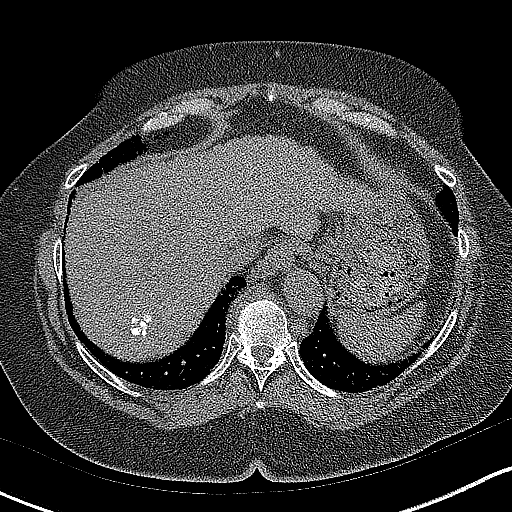
[im 28/48  soft-tissue]
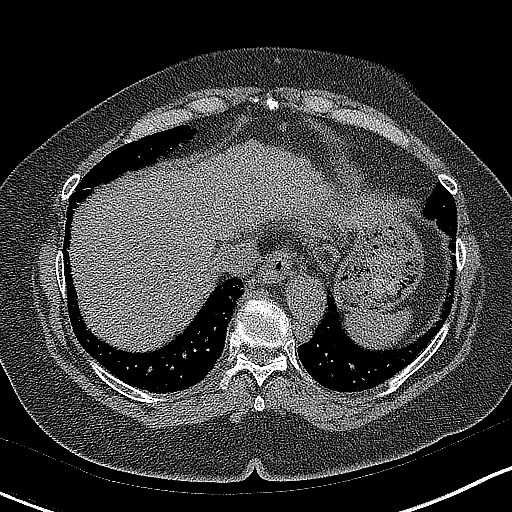
[im 31/48  soft-tissue]
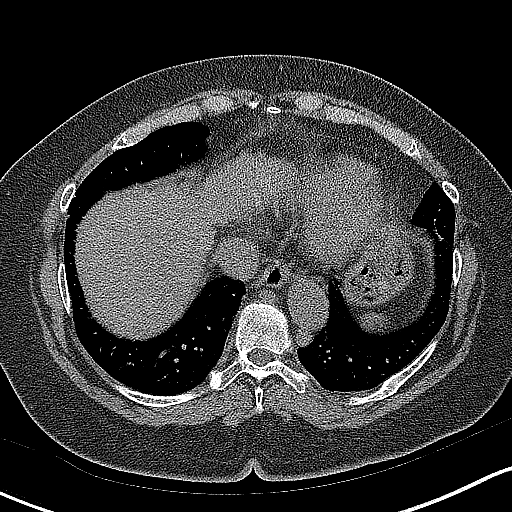
[im 31/48  bone]
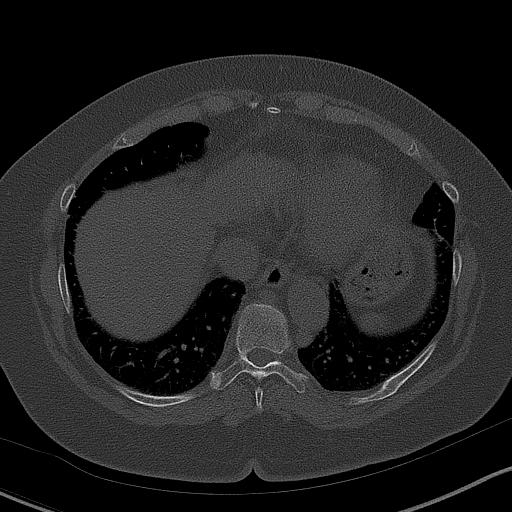
[im 34/48  soft-tissue]
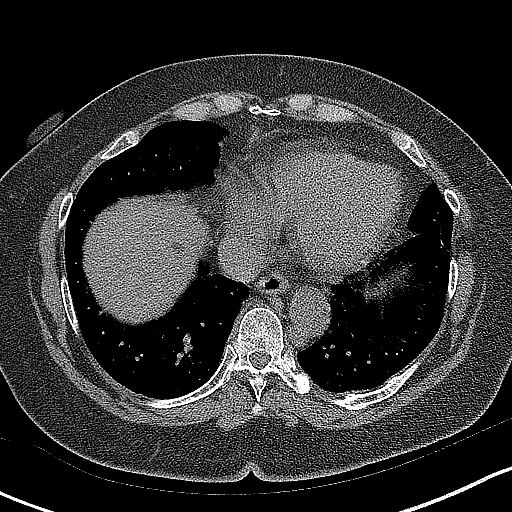
[im 38/48  soft-tissue]
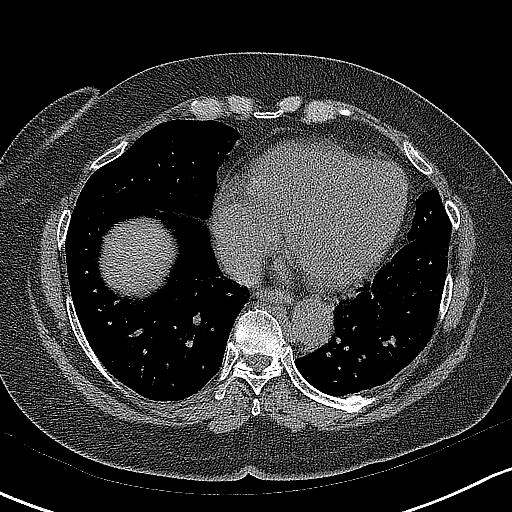
[im 41/48  soft-tissue]
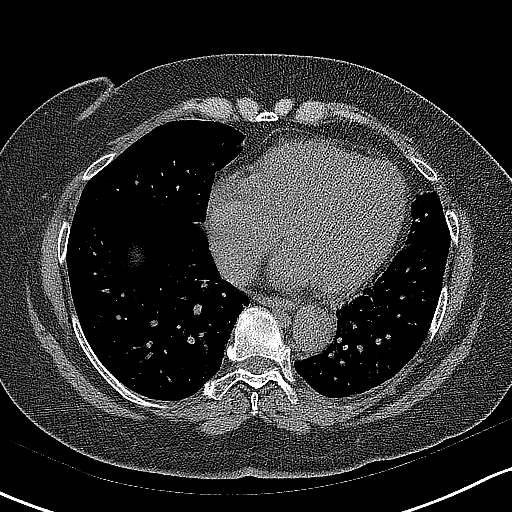
[im 44/48  soft-tissue]
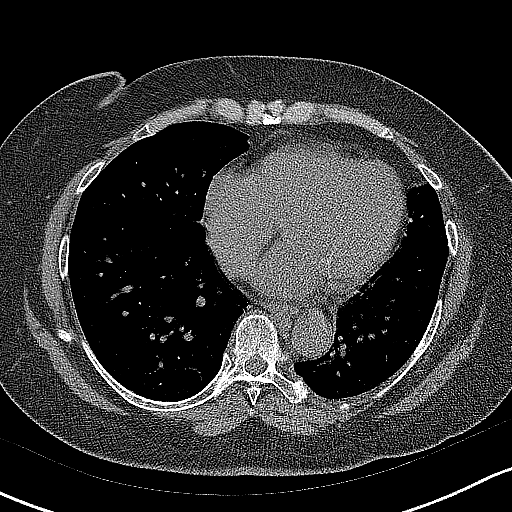

[Series 5: coronal · coronal · 0.81mm/px · 3 of 176 slices shown]
[im 59/176  soft-tissue]
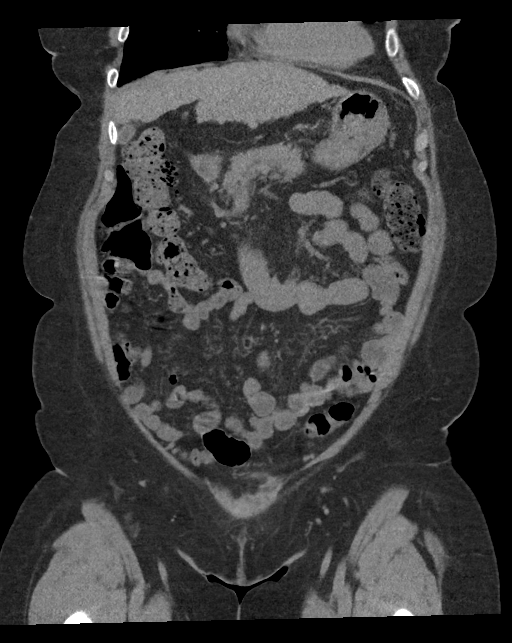
[im 78/176  soft-tissue]
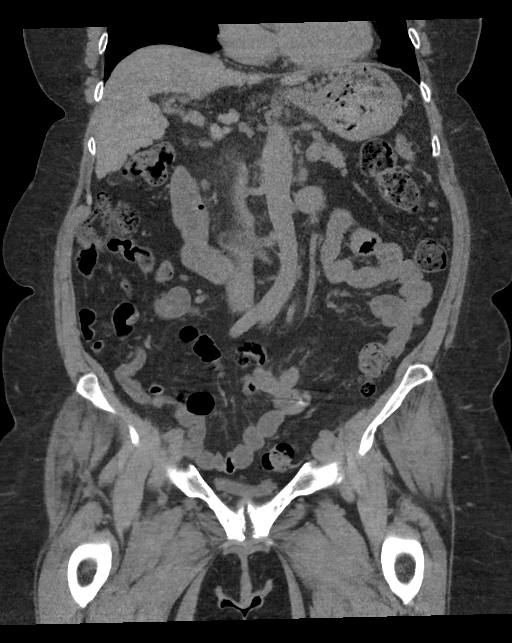
[im 98/176  soft-tissue]
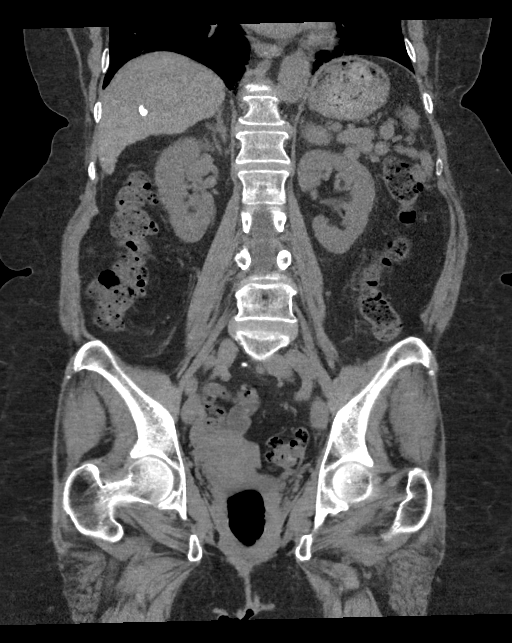

[16 of 46 positions shown; findings below may reference images not displayed]

FINDINGS: Lower chest: Lung bases are free of acute infiltrate or sizable
effusion.

Hepatobiliary: Diffuse cirrhotic change of the liver is noted.
Multifocal areas of increased attenuation are noted consistent with
prior embolotherapy. Gallbladder is decompressed with evidence of
cholelithiasis

Pancreas: Unremarkable. No pancreatic ductal dilatation or
surrounding inflammatory changes.

Spleen: Normal in size without focal abnormality. Large splenule is
noted adjacent to the upper margin of left kidney.

Adrenals/Urinary Tract: Adrenal glands are within normal limits.
Kidneys demonstrate no renal calculi or obstructive changes. The
bladder is decompressed.

Stomach/Bowel: Scattered diverticular change of the colon is noted.
No findings to suggest diverticulitis are seen. The appendix is
within normal limits. Small bowel and stomach are unremarkable.

Vascular/Lymphatic: No significant atherosclerotic changes are
noted. No lymphadenopathy is seen. Multiple splenic varices are
identified. Previously seen esophageal varices are less well
visualized is a lack of IV contrast. Spontaneous splenorenal shunt
is again identified and stable from the prior exam.

Reproductive: Uterus and bilateral adnexa are unremarkable.

Other: No abdominal wall hernia or abnormality. No abdominopelvic
ascites.

Musculoskeletal: Degenerative changes of lumbar spine are noted.
IMPRESSION: Cirrhotic change of the liver with findings of prior embolotherapy.

Cholelithiasis without complicating factors.

Diverticulosis without diverticulitis.

Varices consistent with underlying portal hypertension with a
spontaneous splenorenal shunt.
